# Patient Record
Sex: Male | Born: 1970 | Hispanic: No | Marital: Married | Smoking: Never smoker
Health system: Southern US, Community
[De-identification: ages and names within clinical notes are randomized; demographics above are authoritative.]

## PROBLEM LIST (undated history)

## (undated) DIAGNOSIS — E785 Hyperlipidemia, unspecified: Secondary | ICD-10-CM

## (undated) HISTORY — DX: Hyperlipidemia, unspecified: E78.5

---

## 1998-11-24 ENCOUNTER — Encounter: Admission: RE | Admit: 1998-11-24 | Discharge: 1998-11-24 | Payer: Self-pay | Admitting: Family Medicine

## 2000-10-25 ENCOUNTER — Ambulatory Visit (HOSPITAL_COMMUNITY): Admission: RE | Admit: 2000-10-25 | Discharge: 2000-10-25 | Payer: Self-pay | Admitting: Family Medicine

## 2000-10-25 ENCOUNTER — Encounter: Payer: Self-pay | Admitting: Family Medicine

## 2014-07-08 ENCOUNTER — Ambulatory Visit (HOSPITAL_COMMUNITY): Payer: No Typology Code available for payment source | Attending: Cardiology | Admitting: Cardiology

## 2014-07-08 ENCOUNTER — Other Ambulatory Visit (HOSPITAL_COMMUNITY): Payer: Self-pay | Admitting: Cardiology

## 2014-07-08 DIAGNOSIS — M79605 Pain in left leg: Secondary | ICD-10-CM | POA: Diagnosis not present

## 2014-07-08 DIAGNOSIS — M7989 Other specified soft tissue disorders: Secondary | ICD-10-CM | POA: Diagnosis not present

## 2014-07-08 NOTE — Progress Notes (Signed)
Left lower extremity venous duplex performed  

## 2016-12-22 DIAGNOSIS — M542 Cervicalgia: Secondary | ICD-10-CM | POA: Diagnosis not present

## 2016-12-22 DIAGNOSIS — M25511 Pain in right shoulder: Secondary | ICD-10-CM | POA: Diagnosis not present

## 2016-12-30 DIAGNOSIS — M25511 Pain in right shoulder: Secondary | ICD-10-CM | POA: Diagnosis not present

## 2017-01-05 DIAGNOSIS — M25531 Pain in right wrist: Secondary | ICD-10-CM | POA: Diagnosis not present

## 2018-10-27 DIAGNOSIS — R05 Cough: Secondary | ICD-10-CM | POA: Diagnosis not present

## 2018-10-31 DIAGNOSIS — J209 Acute bronchitis, unspecified: Secondary | ICD-10-CM | POA: Diagnosis not present

## 2018-11-07 DIAGNOSIS — R05 Cough: Secondary | ICD-10-CM | POA: Diagnosis not present

## 2019-09-22 ENCOUNTER — Ambulatory Visit: Payer: No Typology Code available for payment source | Attending: Internal Medicine

## 2019-09-22 DIAGNOSIS — Z23 Encounter for immunization: Secondary | ICD-10-CM

## 2019-09-22 NOTE — Progress Notes (Signed)
   Covid-19 Vaccination Clinic  Name:  Jay Johnston    MRN: [id number] DOB: [birth year]  09/22/2019  Jay Johnston was observed post Covid-19 immunization for 15 minutes without incident. He was provided with Vaccine Information Sheet and instruction to access the V-Safe system.   Jay Johnston was instructed to call 911 with any severe reactions post vaccine: Marland Kitchen Difficulty breathing  . Swelling of face and throat  . A fast heartbeat  . A bad rash all over body  . Dizziness and weakness   Immunizations Administered    Name Date Dose VIS Date Route   Pfizer COVID-19 Vaccine 09/22/2019  3:29 PM 0.3 mL 06/22/2019 Intramuscular   Manufacturer: [employer], [employer]   Lot: [id number]   NDC: [address]-4

## 2019-10-17 ENCOUNTER — Ambulatory Visit: Payer: No Typology Code available for payment source | Attending: Internal Medicine

## 2019-10-17 DIAGNOSIS — Z23 Encounter for immunization: Secondary | ICD-10-CM

## 2019-10-17 NOTE — Progress Notes (Signed)
   Covid-19 Vaccination Clinic  Name:  Jay Johnston    MRN: [id number] DOB: 01-10-1971  10/17/2019  [name] was observed post Covid-19 immunization for 15 minutes without incident. He was provided with Vaccine Information Sheet and instruction to access the V-Safe system.   [name] was instructed to call 911 with any severe reactions post vaccine: Marland Kitchen Difficulty breathing  . Swelling of face and throat  . A fast heartbeat  . A bad rash all over body  . Dizziness and weakness   Immunizations Administered    Name Date Dose VIS Date Route   Pfizer COVID-19 Vaccine 10/17/2019  2:10 PM 0.3 mL 06/22/2019 Intramuscular   Manufacturer: [employer], [employer]   Lot: [id number]   NDC: [address]-2

## 2019-11-13 ENCOUNTER — Other Ambulatory Visit: Payer: Self-pay

## 2019-11-13 ENCOUNTER — Emergency Department (HOSPITAL_COMMUNITY)
Admission: EM | Admit: 2019-11-13 | Discharge: 2019-11-14 | Disposition: A | Discharge status: Home / Self Care | Payer: 59 | Attending: Emergency Medicine | Admitting: Emergency Medicine

## 2019-11-13 ENCOUNTER — Emergency Department (HOSPITAL_COMMUNITY): Payer: 59

## 2019-11-13 ENCOUNTER — Encounter (HOSPITAL_COMMUNITY): Payer: Self-pay | Admitting: Emergency Medicine

## 2019-11-13 DIAGNOSIS — Y999 Unspecified external cause status: Secondary | ICD-10-CM | POA: Insufficient documentation

## 2019-11-13 DIAGNOSIS — W51XXXA Accidental striking against or bumped into by another person, initial encounter: Secondary | ICD-10-CM | POA: Insufficient documentation

## 2019-11-13 DIAGNOSIS — Y9366 Activity, soccer: Secondary | ICD-10-CM | POA: Diagnosis not present

## 2019-11-13 DIAGNOSIS — S0990XA Unspecified injury of head, initial encounter: Secondary | ICD-10-CM | POA: Diagnosis present

## 2019-11-13 DIAGNOSIS — Y929 Unspecified place or not applicable: Secondary | ICD-10-CM | POA: Diagnosis not present

## 2019-11-13 DIAGNOSIS — S060X1A Concussion with loss of consciousness of 30 minutes or less, initial encounter: Secondary | ICD-10-CM

## 2019-11-13 DIAGNOSIS — R4182 Altered mental status, unspecified: Secondary | ICD-10-CM | POA: Diagnosis not present

## 2019-11-13 LAB — CBC WITH DIFFERENTIAL/PLATELET
Abs Immature Granulocytes: 0.06 10*3/uL (ref 0.00–0.07)
Basophils Absolute: 0 10*3/uL (ref 0.0–0.1)
Basophils Relative: 0 %
Eosinophils Absolute: 0.1 10*3/uL (ref 0.0–0.5)
Eosinophils Relative: 1 %
HCT: 43.7 % (ref 39.0–52.0)
Hemoglobin: 14.7 g/dL (ref 13.0–17.0)
Immature Granulocytes: 1 %
Lymphocytes Relative: 13 %
Lymphs Abs: 1.4 10*3/uL (ref 0.7–4.0)
MCH: 28.1 pg (ref 26.0–34.0)
MCHC: 33.6 g/dL (ref 30.0–36.0)
MCV: 83.6 fL (ref 80.0–100.0)
Monocytes Absolute: 0.6 10*3/uL (ref 0.1–1.0)
Monocytes Relative: 5 %
Neutro Abs: 8.9 10*3/uL — ABNORMAL HIGH (ref 1.7–7.7)
Neutrophils Relative %: 80 %
Platelets: 248 10*3/uL (ref 150–400)
RBC: 5.23 MIL/uL (ref 4.22–5.81)
RDW: 11.8 % (ref 11.5–15.5)
WBC: 11 10*3/uL — ABNORMAL HIGH (ref 4.0–10.5)
nRBC: 0 % (ref 0.0–0.2)

## 2019-11-13 LAB — BASIC METABOLIC PANEL
Anion gap: 4 — ABNORMAL LOW (ref 5–15)
BUN: 19 mg/dL (ref 6–20)
CO2: 29 mmol/L (ref 22–32)
Calcium: 9.3 mg/dL (ref 8.9–10.3)
Chloride: 106 mmol/L (ref 98–111)
Creatinine, Ser: 1.24 mg/dL (ref 0.61–1.24)
GFR calc Af Amer: >60 mL/min (ref >60)
GFR calc non Af Amer: >60 mL/min (ref >60)
Glucose, Bld: 138 mg/dL — ABNORMAL HIGH (ref 70–99)
Potassium: 4.8 mmol/L (ref 3.5–5.1)
Sodium: 139 mmol/L (ref 135–145)

## 2019-11-13 MED ORDER — PROMETHAZINE HCL 25 MG/ML IJ SOLN
12.5000 mg | Freq: Once | INTRAMUSCULAR | Status: AC
  Administered 2019-11-13: 23:00:00 12.5 mg via INTRAVENOUS
  Filled 2019-11-13: qty 1

## 2019-11-13 NOTE — ED Triage Notes (Addendum)
Per [facility], pt was playing soccer, collided w/ another player, fell to a sitting position and then fell back and hit his head.  Ground consisted of artificial turf on concrete floor.  Pt has positive LOC for 20-30 seconds.  C/o headache and neck pain, placed in a C-collar by  EMS.  Pt does not remember the event but is alert and oreinted X 3 (trouble w/ dates) and has some repetititive questions.  Given 4mg  of zofran for nausea no relief.    96% RA 145/89 80 HR 18G L AC

## 2019-11-13 NOTE — ED Notes (Signed)
Pt to CT

## 2019-11-13 NOTE — ED Provider Notes (Signed)
[facility] EMERGENCY DEPARTMENT Provider Note   CSN: [id number] Arrival date & time: 11/13/19  2227     History Chief Complaint  Patient presents with  . Altered Mental Status    Jay Johnston is a 49 y.o. male.  Patient to ED from soccer match after he collided with another play head on. EMS reports he fell back to a sitting position and then fell backward with a brief LOC. He has had nausea since waking up and complains of neck pain. He is having difficulty remembering details of the injury. He denies chest/abdomen/extremity pain or numbness/weakness. Per EMS, he has had repetitive questioning since being picked up.   The history is provided by the patient. No language interpreter was used.  Altered Mental Status Presenting symptoms: confusion   Associated symptoms: headaches   Associated symptoms: no abdominal pain        No past medical history on file.     No family history on file.  Social History   Tobacco Use  . Smoking status: Not on file  Substance Use Topics  . Alcohol use: Not on file  . Drug use: Not on file    Home Medications Prior to Admission medications   Not on File    Allergies    Patient has no allergy information on record.  Review of Systems   Review of Systems  Cardiovascular: Negative for chest pain.  Gastrointestinal: Negative for abdominal pain.  Musculoskeletal: Positive for neck pain.  Skin: Negative for wound.  Neurological: Positive for headaches.  Psychiatric/Behavioral: Positive for confusion.    Physical Exam Updated Vital Signs There were no vitals taken for this visit.  Physical Exam Vitals and nursing note reviewed.  Constitutional:      Appearance: He is well-developed.  HENT:     Head: Normocephalic and atraumatic.     Nose: Nose normal.  Eyes:     Extraocular Movements: Extraocular movements intact.     Conjunctiva/sclera: Conjunctivae normal.     Pupils: Pupils are equal, round, and  reactive to light.  Cardiovascular:     Rate and Rhythm: Normal rate and regular rhythm.  Pulmonary:     Effort: Pulmonary effort is normal.     Breath sounds: Normal breath sounds. No wheezing, rhonchi or rales.  Chest:     Chest wall: No tenderness.  Abdominal:     General: Bowel sounds are normal.     Palpations: Abdomen is soft.     Tenderness: There is no abdominal tenderness. There is no guarding or rebound.  Musculoskeletal:        General: Normal range of motion.     Cervical back: Normal range of motion and neck supple.  Skin:    General: Skin is warm and dry.     Findings: No rash.  Neurological:     Mental Status: He is alert.     GCS: GCS eye subscore is 4. GCS verbal subscore is 5. GCS motor subscore is 6.     Cranial Nerves: No dysarthria or facial asymmetry.     Motor: No weakness or abnormal muscle tone.     Coordination: Coordination normal.     Comments: Speech clear. Delayed memory response to date but accurate.      ED Results / Procedures / Treatments   Labs (all labs ordered are listed, but only abnormal results are displayed) Labs Reviewed - No data to display  EKG None  Radiology No results found.  Procedures Procedures (including critical care time)  Medications Ordered in ED Medications  promethazine (PHENERGAN) injection 12.5 mg (has no administration in time range)    ED Course  I have reviewed the triage vital signs and the nursing notes.  Pertinent labs & imaging results that were available during my care of the patient were reviewed by me and considered in my medical decision making (see chart for details).    MDM Rules/Calculators/A&P                      Patient to ED after head injury while playing soccer, brief LOC, nausea, repetitive speech, oriented. CT's pending.   11:20 - re-eval - patient's recall improving. Aware of date and current president. Remembered my name and our previous conversation. Wife at bedside.   12:15  - CT studies negative. C-collar removed by me. FROM of neck without pain. He is ambulated and is steady. He can be discharged home. Stressed the importance of follow up with the concussion clinic in one week, which may be delayed due to wife's scheduled delivery induction.   Return precautions discussed.   Final Clinical Impression(s) / ED Diagnoses Final diagnoses:  None   1. Concussion  Rx / DC Orders ED Discharge Orders    None       Elpidio Anis, PA-C 11/14/19 0020    Shon Baton, MD 11/14/19 [phone]

## 2019-11-13 NOTE — ED Notes (Signed)
Provider at bedside

## 2019-11-14 ENCOUNTER — Telehealth: Payer: Self-pay | Admitting: Physical Therapy

## 2019-11-14 MED ORDER — ONDANSETRON 4 MG PO TBDP
4.0000 mg | ORAL_TABLET | Freq: Three times a day (TID) | ORAL | 0 refills | Status: DC | PRN
Start: 2019-11-14 — End: 2019-11-30

## 2019-11-14 NOTE — ED Notes (Signed)
Provider removed c-collar

## 2019-11-14 NOTE — ED Notes (Signed)
Stood pt up at bedside and walked a couple of steps. Pt was steady, reports his head is "only a little dizzy."

## 2019-11-14 NOTE — Telephone Encounter (Signed)
Called pt and LM on cell phone to return call if he would like to schedule a new pt appt for a concussion sustained on 11/13/19.  Pt can be scheduled in any 30 min slot that is between 8am-3:30 pm.  Have one block on hold tomorrow morning at 9:30 am or could do the 9:45am or 2:45 pm slot on Friday if needed.

## 2019-11-14 NOTE — Discharge Instructions (Signed)
It is important to follow the care instructions provided with your discharge papers. Follow up with the Concussion Clinic with [facility] in one week. Do not return to sports play until cleared by the Sports Medicine doctor.   Return to the emergency department with any worsening symptoms - severe headache, uncontrolled vomiting, severe dizziness or new concern.   Take Zofran for nausea and Tylenol for headache.

## 2019-11-15 ENCOUNTER — Telehealth: Payer: Self-pay | Admitting: Physical Therapy

## 2019-11-15 NOTE — Telephone Encounter (Signed)
Called and LM x 2 for pt to call and schedule an appt w/ Concussion clinic.  May schedule at any 30 min block between 8am-3:30 pm.

## 2019-11-30 ENCOUNTER — Other Ambulatory Visit: Payer: Self-pay

## 2019-11-30 ENCOUNTER — Ambulatory Visit (INDEPENDENT_AMBULATORY_CARE_PROVIDER_SITE_OTHER): Payer: 59 | Admitting: Family Medicine

## 2019-11-30 ENCOUNTER — Encounter: Payer: Self-pay | Admitting: Family Medicine

## 2019-11-30 VITALS — BP 110/70 | HR 76 | Ht 70.0 in | Wt 216.6 lb

## 2019-11-30 DIAGNOSIS — S060X9A Concussion with loss of consciousness of unspecified duration, initial encounter: Secondary | ICD-10-CM | POA: Diagnosis not present

## 2019-11-30 DIAGNOSIS — H8111 Benign paroxysmal vertigo, right ear: Secondary | ICD-10-CM

## 2019-11-30 NOTE — Progress Notes (Signed)
Subjective:    Chief Complaint: [name], LAT, ATC, am serving as scribe for Dr. Lynne Leader.  Jay Johnston,  is a 49 y.o. male who presents for evaluation of a head injury sustained on 11/13/19 while playing soccer.  Pt collided with another player and hit the back of his head on the ground.  Bystanders reported a brief LOC.  At the time of injury he reported neck pain and difficulty w/ memory.  Since then, pt reports issues w/ dizziness, neck pain, memory.  Looking up or down quickly, transitioning from sit to supine and laying supine in bed or trying to rotate in bed.  Injury date : 11/13/19 Visit #: 1   History of Present Illness:    Concussion Self-Reported Symptom Score Symptoms rated on a scale 1-6, in last 24 hours   Headache: 0    Nausea: 0  Dizziness: 5  Vomiting: 0  Balance Difficulty: 0   Trouble Falling Asleep: 2   Fatigue: 0  Sleep Less Than Usual: 0  Daytime Drowsiness: 0  Sleep More Than Usual: 0  Photophobia: 0  Phonophobia: 0  Irritability: 0  Sadness: 0  Numbness or Tingling: 0  Nervousness: 0  Feeling More Emotional: 3  Feeling Mentally Foggy: 0  Feeling Slowed Down: 0  Memory Problems: 0  Difficulty Concentrating: 0  Visual Problems: 0   Total # of Symptoms: 3/22 Total Symptom Score: 10/132 Previous Symptom Score: N/A   Neck Pain: Yes/No  Tinnitus: Yes/No  Review of Systems: No fevers or chills    Review of History: No history of vertigo.  Recently had a baby.  Owns a Chiropractor.  Objective:    Physical Examination Vitals:   11/30/19 0911  BP: 110/70  Pulse: 76  SpO2: 97%   MSK: C-spine normal-appearing normal motion. Neuro: Alert and oriented normal coordination.  Balance slight impaired to single-leg and tandem stance normal double leg stance. Significantly positive right Dix-Hallpike test. Psych: Normal speech thought process and affect.   Imaging:   CT Head Wo Contrast  Result Date: 11/13/2019 CLINICAL DATA:  [place],  hit back of head, loss of consciousness, headache EXAM: CT HEAD WITHOUT CONTRAST TECHNIQUE: Contiguous axial images were obtained from the base of the skull through the vertex without intravenous contrast. COMPARISON:  None. FINDINGS: Brain: No acute infarct or hemorrhage. Lateral ventricles and midline structures are unremarkable. No acute extra-axial fluid collections. No mass effect. Vascular: No hyperdense vessel or unexpected calcification. Skull: Normal. Negative for fracture or focal lesion. Sinuses/Orbits: Minimal mucosal thickening within the bilateral maxillary sinuses. Other: None. IMPRESSION: 1. No acute intracranial process. Electronically Signed   By: Randa Ngo M.D.   On: 11/13/2019 23:17   CT Cervical Spine Wo Contrast  Result Date: 11/13/2019 CLINICAL DATA:  [place], hit back of head, loss of consciousness, neck pain EXAM: CT CERVICAL SPINE WITHOUT CONTRAST TECHNIQUE: Multidetector CT imaging of the cervical spine was performed without intravenous contrast. Multiplanar CT image reconstructions were also generated. COMPARISON:  None. FINDINGS: Alignment: Alignment is anatomic. Skull base and vertebrae: No acute displaced fracture. Soft tissues and spinal canal: No prevertebral fluid or swelling. No visible canal hematoma. Disc levels: Mild spondylosis at C5-6 and C6-7, with symmetrical bilateral neural foraminal encroachment at those levels. Upper chest: Airway is patent.  Lung apices are clear. Other: Reconstructed images demonstrate no additional findings. IMPRESSION: 1. Mild lower cervical spondylosis.  No acute displaced fracture. Electronically Signed   By: Randa Ngo M.D.   On:  11/13/2019 23:19   I, Clementeen Graham, personally (independently) visualized and performed the interpretation of the images attached in this note.   Assessment and Plan   49 y.o. male with vertigo.  Patient certainly did have a concussion however I think the majority of his symptoms today are likely not related  directly to the concussion but more the impact.  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  Other than his vertigo symptoms he does not have significant concussion symptoms and has effectively returned to normal.  Discussed options.  Plan for referral to vestibular physical therapy which will be very helpful for management of BPPV.  Discussed meclizine which I do not think is going to be very helpful but could be used if needed.  Recheck back with me as needed.  Precautions reviewed.    Action/Discussion: Reviewed diagnosis, management options, expected outcomes, and the reasons for scheduled and emergent follow-up. Questions were adequately answered. Patient expressed verbal understanding and agreement with the following plan.     Patient Education:  Reviewed with patient the risks (i.e, a repeat concussion, post-concussion syndrome, second-impact syndrome) of returning to play prior to complete resolution, and thoroughly reviewed the signs and symptoms of concussion.Reviewed need for complete resolution of all symptoms, with rest AND exertion, prior to return to play.  Reviewed red flags for urgent medical evaluation: worsening symptoms, nausea/vomiting, intractable headache, musculoskeletal changes, focal neurological deficits.  Sports Concussion Clinic's Concussion Care Plan, which clearly outlines the plans stated above, was given to patient.   In addition to the time spent performing tests, I spent 30 min   Reviewed with patient the risks (i.e, a repeat concussion, post-concussion syndrome, second-impact syndrome) of returning to play prior to complete resolution, and thoroughly reviewed the signs and symptoms of      concussion. Reviewedf need for complete resolution of all symptoms, with rest AND exertion, prior to return to play.  Reviewed red flags for urgent medical evaluation: worsening symptoms, nausea/vomiting, intractable  headache, musculoskeletal changes, focal neurological deficits.  Sports Concussion Clinic's Concussion Care Plan, which clearly outlines the plans stated above, was given to patient   After Visit Summary printed out and provided to patient as appropriate.  The above documentation has been reviewed and is accurate and complete Clementeen Graham

## 2019-11-30 NOTE — Patient Instructions (Addendum)
Thank you for coming in today. I will refer to vestibular PT If you have a bad time with the dizziness ok to use meclizine over the counter up to 3x daily.   Recheck with me as needed.  Ok to contact me sooner if needed.    Benign Positional Vertigo Vertigo is the feeling that you or your surroundings are moving when they are not. Benign positional vertigo is the most common form of vertigo. This is usually a harmless condition (benign). This condition is positional. This means that symptoms are triggered by certain movements and positions. This condition can be dangerous if it occurs while you are doing something that could cause harm to you or others. This includes activities such as driving or operating machinery. What are the causes? In many cases, the cause of this condition is not known. It may be caused by a disturbance in an area of the inner ear that helps your brain to sense movement and balance. This disturbance can be caused by:  Viral infection (labyrinthitis).  Head injury.  Repetitive motion, such as jumping, dancing, or running. What increases the risk? You are more likely to develop this condition if:  You are a woman.  You are 40 years of age or older. What are the signs or symptoms? Symptoms of this condition usually happen when you move your head or your eyes in different directions. Symptoms may start suddenly, and usually last for less than a minute. They include:  Loss of balance and falling.  Feeling like you are spinning or moving.  Feeling like your surroundings are spinning or moving.  Nausea and vomiting.  Blurred vision.  Dizziness.  Involuntary eye movement (nystagmus). Symptoms can be mild and cause only minor problems, or they can be severe and interfere with daily life. Episodes of benign positional vertigo may return (recur) over time. Symptoms may improve over time. How is this diagnosed? This condition may be diagnosed based on:  Your  medical history.  Physical exam of the head, neck, and ears.  Tests, such as: ? MRI. ? CT scan. ? Eye movement tests. Your health care provider may ask you to change positions quickly while he or she watches you for symptoms of benign positional vertigo, such as nystagmus. Eye movement may be tested with a variety of exams that are designed to evaluate or stimulate vertigo. ? An electroencephalogram (EEG). This records electrical activity in your brain. ? Hearing tests. You may be referred to a health care provider who specializes in ear, nose, and throat (ENT) problems (otolaryngologist) or a provider who specializes in disorders of the nervous system (neurologist). How is this treated?  This condition may be treated in a session in which your health care provider moves your head in specific positions to adjust your inner ear back to normal. Treatment for this condition may take several sessions. Surgery may be needed in severe cases, but this is rare. In some cases, benign positional vertigo may resolve on its own in 2-4 weeks. Follow these instructions at home: Safety  Move slowly. Avoid sudden body or head movements or certain positions, as told by your health care provider.  Avoid driving until your health care provider says it is safe for you to do so.  Avoid operating heavy machinery until your health care provider says it is safe for you to do so.  Avoid doing any tasks that would be dangerous to you or others if vertigo occurs.  If you have trouble  walking or keeping your balance, try using a cane for stability. If you feel dizzy or unstable, sit down right away.  Return to your normal activities as told by your health care provider. Ask your health care provider what activities are safe for you. General instructions  Take over-the-counter and prescription medicines only as told by your health care provider.  Drink enough fluid to keep your urine pale yellow.  Keep all  follow-up visits as told by your health care provider. This is important. Contact a health care provider if:  You have a fever.  Your condition gets worse or you develop new symptoms.  Your family or friends notice any behavioral changes.  You have nausea or vomiting that gets worse.  You have numbness or a "pins and needles" sensation. Get help right away if you:  Have difficulty speaking or moving.  Are always dizzy.  Faint.  Develop severe headaches.  Have weakness in your legs or arms.  Have changes in your hearing or vision.  Develop a stiff neck.  Develop sensitivity to light. Summary  Vertigo is the feeling that you or your surroundings are moving when they are not. Benign positional vertigo is the most common form of vertigo.  The cause of this condition is not known. It may be caused by a disturbance in an area of the inner ear that helps your brain to sense movement and balance.  Symptoms include loss of balance and falling, feeling that you or your surroundings are moving, nausea and vomiting, and blurred vision.  This condition can be diagnosed based on symptoms, physical exam, and other tests, such as MRI, CT scan, eye movement tests, and hearing tests.  Follow safety instructions as told by your health care provider. You will also be told when to contact your health care provider in case of problems. This information is not intended to replace advice given to you by your health care provider. Make sure you discuss any questions you have with your health care provider. Document Revised: 12/07/2017 Document Reviewed: 12/07/2017 Elsevier Patient Education  [place].   How to Perform the Epley Maneuver The Epley maneuver is an exercise that relieves symptoms of vertigo. Vertigo is the feeling that you or your surroundings are moving when they are not. When you feel vertigo, you may feel like the room is spinning and have trouble walking. Dizziness is  a little different than vertigo. When you are dizzy, you may feel unsteady or light-headed. You can do this maneuver at home whenever you have symptoms of vertigo. You can do it up to 3 times a day until your symptoms go away. Even though the Epley maneuver may relieve your vertigo for a few weeks, it is possible that your symptoms will return. This maneuver relieves vertigo, but it does not relieve dizziness. What are the risks? If it is done correctly, the Epley maneuver is considered safe. Sometimes it can lead to dizziness or nausea that goes away after a short time. If you develop other symptoms, such as changes in vision, weakness, or numbness, stop doing the maneuver and call your health care provider. How to perform the Epley maneuver 1. Sit on the edge of a bed or table with your back straight and your legs extended or hanging over the edge of the bed or table. 2. Turn your head halfway toward the affected ear or side. 3. Lie backward quickly with your head turned until you are lying flat on your back.  You may want to position a pillow under your shoulders. 4. Hold this position for 30 seconds. You may experience an attack of vertigo. This is normal. 5. Turn your head to the opposite direction until your unaffected ear is facing the floor. 6. Hold this position for 30 seconds. You may experience an attack of vertigo. This is normal. Hold this position until the vertigo stops. 7. Turn your whole body to the same side as your head. Hold for another 30 seconds. 8. Sit back up. You can repeat this exercise up to 3 times a day. Follow these instructions at home:  After doing the Epley maneuver, you can return to your normal activities.  Ask your health care provider if there is anything you should do at home to prevent vertigo. He or she may recommend that you: ? Keep your head raised (elevated) with two or more pillows while you sleep. ? Do not sleep on the side of your affected ear. ? Get  up slowly from bed. ? Avoid sudden movements during the day. ? Avoid extreme head movement, like looking up or bending over. Contact a health care provider if:  Your vertigo gets worse.  You have other symptoms, including: ? Nausea. ? Vomiting. ? Headache. Get help right away if:  You have vision changes.  You have a severe or worsening headache or neck pain.  You cannot stop vomiting.  You have new numbness or weakness in any part of your body. Summary  Vertigo is the feeling that you or your surroundings are moving when they are not.  The Epley maneuver is an exercise that relieves symptoms of vertigo.  If the Epley maneuver is done correctly, it is considered safe. You can do it up to 3 times a day. This information is not intended to replace advice given to you by your health care provider. Make sure you discuss any questions you have with your health care provider. Document Revised: 06/10/2017 Document Reviewed: 05/18/2016 Elsevier Patient Education  2020 [employer].

## 2019-12-06 ENCOUNTER — Ambulatory Visit: Payer: 59 | Attending: Family Medicine | Admitting: Physical Therapy

## 2019-12-06 ENCOUNTER — Other Ambulatory Visit: Payer: Self-pay

## 2019-12-06 ENCOUNTER — Ambulatory Visit: Payer: 59 | Admitting: Physical Therapy

## 2019-12-06 ENCOUNTER — Encounter: Payer: Self-pay | Admitting: Physical Therapy

## 2019-12-06 DIAGNOSIS — R42 Dizziness and giddiness: Secondary | ICD-10-CM | POA: Insufficient documentation

## 2019-12-06 DIAGNOSIS — H8111 Benign paroxysmal vertigo, right ear: Secondary | ICD-10-CM | POA: Diagnosis not present
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
## 2020-07-19 ENCOUNTER — Other Ambulatory Visit: Payer: Self-pay

## 2020-07-19 DIAGNOSIS — Z20822 Contact with and (suspected) exposure to covid-19: Secondary | ICD-10-CM

## 2020-07-22 LAB — NOVEL CORONAVIRUS, NAA: SARS-CoV-2, NAA: NOT DETECTED
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
## 2021-09-26 ENCOUNTER — Other Ambulatory Visit: Payer: Self-pay

## 2021-09-26 ENCOUNTER — Emergency Department (HOSPITAL_BASED_OUTPATIENT_CLINIC_OR_DEPARTMENT_OTHER): Payer: 59 | Admitting: Radiology

## 2021-09-26 ENCOUNTER — Emergency Department (HOSPITAL_BASED_OUTPATIENT_CLINIC_OR_DEPARTMENT_OTHER)
Admission: EM | Admit: 2021-09-26 | Discharge: 2021-09-26 | Disposition: A | Discharge status: Home / Self Care | Payer: 59 | Attending: Emergency Medicine | Admitting: Emergency Medicine

## 2021-09-26 ENCOUNTER — Encounter (HOSPITAL_BASED_OUTPATIENT_CLINIC_OR_DEPARTMENT_OTHER): Payer: Self-pay | Admitting: Pediatrics

## 2021-09-26 DIAGNOSIS — R000 Tachycardia, unspecified: Secondary | ICD-10-CM | POA: Diagnosis not present

## 2021-09-26 DIAGNOSIS — B349 Viral infection, unspecified: Secondary | ICD-10-CM | POA: Diagnosis not present

## 2021-09-26 DIAGNOSIS — J189 Pneumonia, unspecified organism: Secondary | ICD-10-CM | POA: Diagnosis not present

## 2021-09-26 DIAGNOSIS — R112 Nausea with vomiting, unspecified: Secondary | ICD-10-CM

## 2021-09-26 DIAGNOSIS — R059 Cough, unspecified: Secondary | ICD-10-CM | POA: Diagnosis not present

## 2021-09-26 DIAGNOSIS — Z20822 Contact with and (suspected) exposure to covid-19: Secondary | ICD-10-CM | POA: Diagnosis not present

## 2021-09-26 LAB — CBC WITH DIFFERENTIAL/PLATELET
Abs Immature Granulocytes: 0.04 10*3/uL (ref 0.00–0.07)
Basophils Absolute: 0 10*3/uL (ref 0.0–0.1)
Basophils Relative: 0 %
Eosinophils Absolute: 0 10*3/uL (ref 0.0–0.5)
Eosinophils Relative: 0 %
HCT: 40.6 % (ref 39.0–52.0)
Hemoglobin: 13.8 g/dL (ref 13.0–17.0)
Immature Granulocytes: 0 %
Lymphocytes Relative: 8 %
Lymphs Abs: 1 10*3/uL (ref 0.7–4.0)
MCH: 27.8 pg (ref 26.0–34.0)
MCHC: 34 g/dL (ref 30.0–36.0)
MCV: 81.7 fL (ref 80.0–100.0)
Monocytes Absolute: 0.9 10*3/uL (ref 0.1–1.0)
Monocytes Relative: 7 %
Neutro Abs: 10.8 10*3/uL — ABNORMAL HIGH (ref 1.7–7.7)
Neutrophils Relative %: 85 %
Platelets: 231 10*3/uL (ref 150–400)
RBC: 4.97 MIL/uL (ref 4.22–5.81)
RDW: 12.1 % (ref 11.5–15.5)
WBC: 12.8 10*3/uL — ABNORMAL HIGH (ref 4.0–10.5)
nRBC: 0 % (ref 0.0–0.2)

## 2021-09-26 LAB — RESP PANEL BY RT-PCR (FLU A&B, COVID) ARPGX2
Influenza A by PCR: NEGATIVE
Influenza B by PCR: NEGATIVE
SARS Coronavirus 2 by RT PCR: NEGATIVE

## 2021-09-26 LAB — COMPREHENSIVE METABOLIC PANEL
ALT: 25 U/L (ref 0–44)
AST: 19 U/L (ref 15–41)
Albumin: 3.8 g/dL (ref 3.5–5.0)
Alkaline Phosphatase: 67 U/L (ref 38–126)
Anion gap: 7 (ref 5–15)
BUN: 14 mg/dL (ref 6–20)
CO2: 25 mmol/L (ref 22–32)
Calcium: 9.2 mg/dL (ref 8.9–10.3)
Chloride: 99 mmol/L (ref 98–111)
Creatinine, Ser: 1.08 mg/dL (ref 0.61–1.24)
GFR, Estimated: >60 mL/min (ref >60)
Glucose, Bld: 109 mg/dL — ABNORMAL HIGH (ref 70–99)
Potassium: 4 mmol/L (ref 3.5–5.1)
Sodium: 131 mmol/L — ABNORMAL LOW (ref 135–145)
Total Bilirubin: 0.8 mg/dL (ref 0.3–1.2)
Total Protein: 6.6 g/dL (ref 6.5–8.1)

## 2021-09-26 LAB — LIPASE, BLOOD: Lipase: 11 U/L (ref 11–51)

## 2021-09-26 MED ORDER — ACETAMINOPHEN 325 MG PO TABS: 650.0000 mg | ORAL_TABLET | Freq: Four times a day (QID) | ORAL | 0 refills | Status: DC | PRN

## 2021-09-26 MED ORDER — ONDANSETRON HCL 4 MG PO TABS: 4.0000 mg | ORAL_TABLET | ORAL | 0 refills | Status: DC | PRN

## 2021-09-26 MED ORDER — ONDANSETRON HCL 4 MG/2ML IJ SOLN
4.0000 mg | Freq: Once | INTRAMUSCULAR | Status: AC
  Administered 2021-09-26: 4 mg via INTRAVENOUS
  Filled 2021-09-26: qty 2

## 2021-09-26 MED ORDER — SUCRALFATE 1 G PO TABS: 1.0000 g | ORAL_TABLET | Freq: Three times a day (TID) | ORAL | 0 refills | Status: DC

## 2021-09-26 MED ORDER — OXYCODONE HCL 5 MG PO TABS: 5.0000 mg | ORAL_TABLET | ORAL | 0 refills | Status: DC | PRN

## 2021-09-26 MED ORDER — LACTATED RINGERS IV BOLUS
2000.0000 mL | Freq: Once | INTRAVENOUS | Status: AC
  Administered 2021-09-26: 1000 mL via INTRAVENOUS

## 2021-09-26 MED ORDER — KETOROLAC TROMETHAMINE 30 MG/ML IJ SOLN
30.0000 mg | Freq: Once | INTRAMUSCULAR | Status: AC
  Administered 2021-09-26: 30 mg via INTRAVENOUS
  Filled 2021-09-26: qty 1

## 2021-09-26 NOTE — ED Triage Notes (Signed)
C/O cough, nausea, vomiting and body aches x 3 days; unable keep in things down. Denies PMH.  ?

## 2021-09-26 NOTE — ED Notes (Signed)
Patient reported coughing up blood, ? ?

## 2021-09-26 NOTE — ED Provider Notes (Signed)
?[facility] ?Provider Note ? ? ?CSN: [id number] ?Arrival date & time: 09/26/21  [id number] ? ?  ? ?History ? ?Chief Complaint  ?Patient presents with  ? Cough  ? ? ?Jay Johnston is a 51 y.o. male. ? ?This is a 51 y.o. male without significant medical history as below who presents to the ED with complaint of cough, congestion, nausea, vomiting, arthralgias, malaise.  Symptom onset 4 to 5 days ago.  Worse in the past 48 hours.  Difficult to tolerate p.o. secondary to nausea.  He took 80 mg of Motrin last night which did improve his symptoms but made his nausea worse.  Again took Motrin this morning which worsened his nausea.  No vomiting.  No change in bowel or bladder function.  No recent travel or sick contacts.  Does report some blood-streaked sputum over the past few days.  Yellow-colored phlegm intermittently.  No dyspnea or chest pain ? ? ? ?History reviewed. No pertinent past medical history. ? ?No past surgical history on file.  ? ? ?The history is provided by the patient. No language interpreter was used.  ?Cough ?Associated symptoms: chills, fever and myalgias   ? ?  ? ?Home Medications ?Prior to Admission medications   ?Medication Sig Start Date End Date Taking? Authorizing Provider  ?acetaminophen (TYLENOL) 325 MG tablet Take 2 tablets (650 mg total) by mouth every 6 (six) hours as needed. 09/26/21  Yes Jeanell Sparrow, DO  ?ondansetron (ZOFRAN) 4 MG tablet Take 1 tablet (4 mg total) by mouth every 4 (four) hours as needed for nausea or vomiting. 09/26/21  Yes Wynona Dove A, DO  ?oxyCODONE (ROXICODONE) 5 MG immediate release tablet Take 1 tablet (5 mg total) by mouth every 4 (four) hours as needed for up to 55 doses for severe pain. 09/26/21  Yes Wynona Dove A, DO  ?sucralfate (CARAFATE) 1 g tablet Take 1 tablet (1 g total) by mouth 4 (four) times daily -  with meals and at bedtime for 5 days. 09/26/21 10/01/21 Yes Jeanell Sparrow, DO  ?   ? ?Allergies    ?Patient has no known allergies.    ? ?Review of Systems   ?Review of Systems  ?Constitutional:  Positive for appetite change, chills and fever.  ?Respiratory:  Positive for cough.   ?Gastrointestinal:  Positive for nausea.  ?Musculoskeletal:  Positive for arthralgias and myalgias.  ? ?Physical Exam ?Updated Vital Signs ?BP 124/76   Pulse 80   Temp 98.9 ?F (37.2 ?C)   Resp (!) 21   Ht 5\' 10"  (1.778 m)   Wt 95.3 kg   SpO2 98%   BMI 30.13 kg/m?  ?Physical Exam ?Vitals and nursing note reviewed.  ?Constitutional:   ?   General: He is not in acute distress. ?   Appearance: He is well-developed. He is not toxic-appearing.  ?HENT:  ?   Head: Normocephalic and atraumatic.  ?   Right Ear: External ear normal.  ?   Left Ear: External ear normal.  ?   Mouth/Throat:  ?   Mouth: Mucous membranes are dry.  ?Eyes:  ?   General: No scleral icterus. ?Cardiovascular:  ?   Rate and Rhythm: Normal rate and regular rhythm.  ?   Pulses: Normal pulses.  ?   Heart sounds: Normal heart sounds.  ?Pulmonary:  ?   Effort: Pulmonary effort is normal. No tachypnea, accessory muscle usage or respiratory distress.  ?   Breath sounds: Normal breath sounds. No wheezing.  ?  Abdominal:  ?   General: Abdomen is flat. There is no distension.  ?   Palpations: Abdomen is soft.  ?   Tenderness: There is no abdominal tenderness. There is no guarding or rebound.  ?Musculoskeletal:     ?   General: Normal range of motion.  ?   Cervical back: Normal range of motion.  ?   Right lower leg: No edema.  ?   Left lower leg: No edema.  ?Skin: ?   General: Skin is warm and dry.  ?   Capillary Refill: Capillary refill takes less than 2 seconds.  ?Neurological:  ?   Mental Status: He is alert and oriented to person, place, and time.  ?Psychiatric:     ?   Mood and Affect: Mood normal.     ?   Behavior: Behavior normal.  ? ? ?ED Results / Procedures / Treatments   ?Labs ?(all labs ordered are listed, but only abnormal results are displayed) ?Labs Reviewed  ?CBC WITH DIFFERENTIAL/PLATELET -  Abnormal; Notable for the following components:  ?    Result Value  ? WBC 12.8 (*)   ? Neutro Abs 10.8 (*)   ? All other components within normal limits  ?COMPREHENSIVE METABOLIC PANEL - Abnormal; Notable for the following components:  ? Sodium 131 (*)   ? Glucose, Bld 109 (*)   ? All other components within normal limits  ?RESP PANEL BY RT-PCR (FLU A&B, COVID) [id number]  ?LIPASE, BLOOD  ? ? ?EKG ?EKG Interpretation ? ?Date/Time:  Saturday September 26 2021 10:28:02 EDT ?Ventricular Rate:  94 ?PR Interval:  149 ?QRS Duration: 86 ?QT Interval:  328 ?QTC Calculation: 411 ?R Axis:   54 ?Text Interpretation: Sinus rhythm Abnormal inferior Q waves Similar to prior tracing Confirmed by Wynona Dove ([id number]) on 09/26/2021 1:07:23 PM ? ?Radiology ?DG Chest 2 View ? ?Result Date: 09/26/2021 ?CLINICAL DATA:  Cough.  Fever. EXAM: CHEST - 2 VIEW COMPARISON:  None. FINDINGS: The heart, hila, and mediastinum are normal. No pneumothorax. No nodules or masses. Minimal atelectasis in the left base. No suspicious infiltrates. No nodules or masses. IMPRESSION: No active cardiopulmonary disease. Electronically Signed   By: Dorise Bullion III M.D.   On: 09/26/2021 09:48   ? ?Procedures ?Procedures  ? ? ?Medications Ordered in ED ?Medications  ?lactated ringers bolus 2,000 mL (1,000 mLs Intravenous New Bag/Given 09/26/21 1040)  ?ketorolac (TORADOL) 30 MG/ML injection 30 mg (30 mg Intravenous Given 09/26/21 1041)  ?ondansetron (ZOFRAN) injection 4 mg (4 mg Intravenous Given 09/26/21 1041)  ? ? ?ED Course/ Medical Decision Making/ A&P ?  ?                        ?Medical Decision Making ?Amount and/or Complexity of Data Reviewed ?Labs: ordered. ?Radiology: ordered. ? ?Risk ?Prescription drug management. ? ? ?Initial Impression and Ddx ?This patient presents to the Emergency Department for the above complaint. This involves an extensive number of treatment options and is a complaint that carries with it a high risk of complications and morbidity. Vital  signs were reviewed.  ? ?Serious etiologies considered. Ddx includes but is not limited to: Viral syndrome, gastroenteritis, metabolic, infectious, dehydration ? ?Patient PMH that increases complexity of ED encounter:  n/a ? ?Previous records obtained and reviewed  ? ?Additional history obtained from spouse ? ? ?Interpretation of Diagnostics ?Labs & imaging results that were available during my care of the patient were visualized by me and considered  in my medical decision making. ?  ?I ordered imaging studies which included CXR and I visualized the imaging and I agree with radiologist interpretation. No acute process noted ? ?Cardiac monitoring reviewed and interpreted personally which shows sinus tachycardia ? ?Social determinants of health include -  N/a ? ?Personally discussed patient care with consultant; n/a ? ? ? ?Patient Reassessment and Ultimate Disposition/Management ? ?Patient management required discussion with the following services or consulting groups:  None ? ?Labs reviewed and are stable, he has mild leukocytosis.  Not septic.  Patient reports overall that he is feeling significantly better following medications and IV fluids.  He is able to tolerate oral intake without difficulty.  Favor viral syndrome as etiology of patient's pending complaints. ? ?Discussed abortive care at home, oral rehydration, analgesics for home, antiemetics.  ? ?Close PCP follow-up. ? ?The patient improved significantly and was discharged in stable condition. Detailed discussions were had with the patient regarding current findings, and need for close f/u with PCP or on call doctor. The patient has been instructed to return immediately if the symptoms worsen in any way for re-evaluation. Patient verbalized understanding and is in agreement with current care plan. All questions answered prior to discharge. ? ? ?  ? ? ? ? ? ? ? ? ? ? ? ? ? ? ? ? ? ? ? ? ? ? ? ?Complexity of Problems Addressed ?Acute illness or injury that  poses threat of life of bodily function ? ?Additional Data Reviewed and Analyzed ?Further history obtained from: ?Further history from spouse/family member, Past medical history and medications listed in the EMR, Prior

## 2021-09-26 NOTE — ED Notes (Signed)
Dc instructions reviewed with patient. Patient voiced understanding. Dc with belongings.  °

## 2021-09-26 NOTE — Discharge Instructions (Addendum)
It was a pleasure caring for you today in the emergency department. ° °Please return to the emergency department for any worsening or worrisome symptoms. ° ° °

## 2021-09-27 ENCOUNTER — Encounter (HOSPITAL_BASED_OUTPATIENT_CLINIC_OR_DEPARTMENT_OTHER): Payer: Self-pay

## 2021-09-27 ENCOUNTER — Other Ambulatory Visit: Payer: Self-pay

## 2021-09-27 DIAGNOSIS — J189 Pneumonia, unspecified organism: Secondary | ICD-10-CM | POA: Insufficient documentation

## 2021-09-27 DIAGNOSIS — R1010 Upper abdominal pain, unspecified: Secondary | ICD-10-CM | POA: Insufficient documentation

## 2021-09-27 DIAGNOSIS — J069 Acute upper respiratory infection, unspecified: Secondary | ICD-10-CM | POA: Diagnosis not present

## 2021-09-27 DIAGNOSIS — R112 Nausea with vomiting, unspecified: Secondary | ICD-10-CM | POA: Insufficient documentation

## 2021-09-27 DIAGNOSIS — Z20822 Contact with and (suspected) exposure to covid-19: Secondary | ICD-10-CM | POA: Diagnosis not present

## 2021-09-27 DIAGNOSIS — R059 Cough, unspecified: Secondary | ICD-10-CM | POA: Diagnosis not present

## 2021-09-27 DIAGNOSIS — R0981 Nasal congestion: Secondary | ICD-10-CM | POA: Diagnosis not present

## 2021-09-27 NOTE — ED Triage Notes (Signed)
Pt was seen here yesterday r/t cough, fevers, body aches, abdominal pain, and nausea. Was dx with a viral syndrome and prescribed zofran, Carafate, and oxycodone. Reports worsening symptoms ?

## 2021-09-28 ENCOUNTER — Emergency Department (HOSPITAL_BASED_OUTPATIENT_CLINIC_OR_DEPARTMENT_OTHER)
Admission: EM | Admit: 2021-09-28 | Discharge: 2021-09-28 | Disposition: A | Discharge status: Home / Self Care | Payer: 59 | Attending: Emergency Medicine | Admitting: Emergency Medicine

## 2021-09-28 ENCOUNTER — Emergency Department (HOSPITAL_BASED_OUTPATIENT_CLINIC_OR_DEPARTMENT_OTHER): Payer: 59

## 2021-09-28 DIAGNOSIS — J189 Pneumonia, unspecified organism: Secondary | ICD-10-CM

## 2021-09-28 DIAGNOSIS — J069 Acute upper respiratory infection, unspecified: Secondary | ICD-10-CM

## 2021-09-28 DIAGNOSIS — R059 Cough, unspecified: Secondary | ICD-10-CM | POA: Diagnosis not present

## 2021-09-28 LAB — CBC WITH DIFFERENTIAL/PLATELET
Abs Immature Granulocytes: 0.1 10*3/uL — ABNORMAL HIGH (ref 0.00–0.07)
Basophils Absolute: 0.1 10*3/uL (ref 0.0–0.1)
Basophils Relative: 0 %
Eosinophils Absolute: 0 10*3/uL (ref 0.0–0.5)
Eosinophils Relative: 0 %
HCT: 42.4 % (ref 39.0–52.0)
Hemoglobin: 14.3 g/dL (ref 13.0–17.0)
Immature Granulocytes: 1 %
Lymphocytes Relative: 7 %
Lymphs Abs: 1.2 10*3/uL (ref 0.7–4.0)
MCH: 27.8 pg (ref 26.0–34.0)
MCHC: 33.7 g/dL (ref 30.0–36.0)
MCV: 82.5 fL (ref 80.0–100.0)
Monocytes Absolute: 0.9 10*3/uL (ref 0.1–1.0)
Monocytes Relative: 6 %
Neutro Abs: 14 10*3/uL — ABNORMAL HIGH (ref 1.7–7.7)
Neutrophils Relative %: 86 %
Platelets: 218 10*3/uL (ref 150–400)
RBC: 5.14 MIL/uL (ref 4.22–5.81)
RDW: 12.2 % (ref 11.5–15.5)
WBC: 16.2 10*3/uL — ABNORMAL HIGH (ref 4.0–10.5)
nRBC: 0 % (ref 0.0–0.2)

## 2021-09-28 LAB — RESP PANEL BY RT-PCR (FLU A&B, COVID) ARPGX2
Influenza A by PCR: NEGATIVE
Influenza B by PCR: NEGATIVE
SARS Coronavirus 2 by RT PCR: NEGATIVE

## 2021-09-28 LAB — COMPREHENSIVE METABOLIC PANEL
ALT: 22 U/L (ref 0–44)
AST: 20 U/L (ref 15–41)
Albumin: 3.5 g/dL (ref 3.5–5.0)
Alkaline Phosphatase: 73 U/L (ref 38–126)
Anion gap: 9 (ref 5–15)
BUN: 15 mg/dL (ref 6–20)
CO2: 27 mmol/L (ref 22–32)
Calcium: 8.9 mg/dL (ref 8.9–10.3)
Chloride: 95 mmol/L — ABNORMAL LOW (ref 98–111)
Creatinine, Ser: 1.17 mg/dL (ref 0.61–1.24)
GFR, Estimated: >60 mL/min (ref >60)
Glucose, Bld: 118 mg/dL — ABNORMAL HIGH (ref 70–99)
Potassium: 3.8 mmol/L (ref 3.5–5.1)
Sodium: 131 mmol/L — ABNORMAL LOW (ref 135–145)
Total Bilirubin: 1 mg/dL (ref 0.3–1.2)
Total Protein: 6.8 g/dL (ref 6.5–8.1)

## 2021-09-28 LAB — GROUP A STREP BY PCR: Group A Strep by PCR: NOT DETECTED

## 2021-09-28 LAB — LIPASE, BLOOD: Lipase: <10 U/L — ABNORMAL LOW (ref 11–51)

## 2021-09-28 MED ORDER — KETOROLAC TROMETHAMINE 30 MG/ML IJ SOLN
30.0000 mg | Freq: Once | INTRAMUSCULAR | Status: AC
  Administered 2021-09-28: 30 mg via INTRAVENOUS
  Filled 2021-09-28: qty 1

## 2021-09-28 MED ORDER — ONDANSETRON HCL 4 MG/2ML IJ SOLN
4.0000 mg | Freq: Once | INTRAMUSCULAR | Status: AC
  Administered 2021-09-28: 4 mg via INTRAVENOUS
  Filled 2021-09-28: qty 2

## 2021-09-28 MED ORDER — SODIUM CHLORIDE 0.9 % IV BOLUS
1000.0000 mL | Freq: Once | INTRAVENOUS | Status: AC
  Administered 2021-09-28: 1000 mL via INTRAVENOUS

## 2021-09-28 MED ORDER — AMOXICILLIN-POT CLAVULANATE 875-125 MG PO TABS
1.0000 | ORAL_TABLET | Freq: Two times a day (BID) | ORAL | 0 refills | Status: DC
Start: 2021-09-28 — End: 2021-12-28

## 2021-09-28 MED ORDER — AZITHROMYCIN 250 MG PO TABS: 250.0000 mg | ORAL_TABLET | Freq: Every day | ORAL | 0 refills | Status: DC

## 2021-09-28 MED ORDER — DICYCLOMINE HCL 10 MG PO CAPS
10.0000 mg | ORAL_CAPSULE | Freq: Once | ORAL | Status: AC
  Administered 2021-09-28: 10 mg via ORAL
  Filled 2021-09-28: qty 1

## 2021-09-28 MED ORDER — PROMETHAZINE HCL 25 MG PO TABS
25.0000 mg | ORAL_TABLET | Freq: Once | ORAL | Status: AC
  Administered 2021-09-28: 25 mg via ORAL
  Filled 2021-09-28: qty 1

## 2021-09-28 MED ORDER — AMOXICILLIN-POT CLAVULANATE 875-125 MG PO TABS
1.0000 | ORAL_TABLET | Freq: Two times a day (BID) | ORAL | Status: DC
  Administered 2021-09-28: 1 via ORAL
  Filled 2021-09-28: qty 1

## 2021-09-28 MED ORDER — SODIUM CHLORIDE 0.9 % IV SOLN
500.0000 mg | Freq: Once | INTRAVENOUS | Status: AC
  Administered 2021-09-28: 500 mg via INTRAVENOUS
  Filled 2021-09-28: qty 5

## 2021-09-28 NOTE — Discharge Instructions (Signed)
You were seen today for ongoing symptoms of cough, fever, nausea, abdominal discomfort.  Your testing remains reassuring.  While your chest x-ray was read as negative, highly suspect he may have a developing left lower lobe pneumonia.  Take medications as prescribed.  Make sure that you are staying hydrated.  Follow-up closely with your primary physician if not improving ?

## 2021-09-28 NOTE — ED Notes (Signed)
Pt verbalizes understanding of discharge instructions. Opportunity for questioning and answers were provided. Pt discharged from ED to home with wife.    

## 2021-09-28 NOTE — ED Provider Notes (Signed)
?[facility] EMERGENCY DEPT ?Provider Note ? ? ?CSN: [id number] ?Arrival date & time: 09/27/21  2050 ? ?  ? ?History ? ?Chief Complaint  ?Patient presents with  ? Cough  ? Abdominal Pain  ? ? ?Jay Johnston is a 51 y.o. male. ? ?HPI ? ?  ? ?This is a 51 year old male who presents with ongoing cough, chills, body aches, abdominal pain, nausea, vomiting.  Wife provides most of the history.  Reports that they were seen and evaluated yesterday morning for the same.  He had negative COVID and influenza testing at that time.  Chest x-ray was clean.  He was discharged with Carafate, Zofran, and oxycodone for symptoms.  He has had ongoing chills.  Wife reports that he is coughing and has what appears to be fresh blood streaked in his sputum.  No sore throat although his wife states that they recently had strep throat in the house.  He has had a fever up to 104 at home.  He reports upper abdominal cramping and pain.  He has been taking Tylenol for his chills with minimal relief. ? ?Home Medications ?Prior to Admission medications   ?Medication Sig Start Date End Date Taking? Authorizing Provider  ?amoxicillin-clavulanate (AUGMENTIN) 875-125 MG tablet Take 1 tablet by mouth every 12 (twelve) hours. 09/28/21  Yes Jenaro Souder, Mayer Masker, MD  ?azithromycin (ZITHROMAX) 250 MG tablet Take 1 tablet (250 mg total) by mouth daily. Take first 2 tablets together, then 1 every day until finished. 09/28/21  Yes Latorsha Curling, Mayer Masker, MD  ?acetaminophen (TYLENOL) 325 MG tablet Take 2 tablets (650 mg total) by mouth every 6 (six) hours as needed. 09/26/21   Sloan Leiter, DO  ?ondansetron (ZOFRAN) 4 MG tablet Take 1 tablet (4 mg total) by mouth every 4 (four) hours as needed for nausea or vomiting. 09/26/21   Tanda Rockers A, DO  ?oxyCODONE (ROXICODONE) 5 MG immediate release tablet Take 1 tablet (5 mg total) by mouth every 4 (four) hours as needed for up to 55 doses for severe pain. 09/26/21   Sloan Leiter, DO  ?sucralfate (CARAFATE) 1  g tablet Take 1 tablet (1 g total) by mouth 4 (four) times daily -  with meals and at bedtime for 5 days. 09/26/21 10/01/21  Sloan Leiter, DO  ?   ? ?Allergies    ?Patient has no known allergies.   ? ?Review of Systems   ?Review of Systems  ?Constitutional:  Positive for chills and fever.  ?HENT:  Positive for congestion.   ?Respiratory:  Positive for cough.   ?Gastrointestinal:  Positive for abdominal pain, nausea and vomiting.  ?Musculoskeletal:  Positive for myalgias.  ?All other systems reviewed and are negative. ? ?Physical Exam ?Updated Vital Signs ?BP 114/78   Pulse 78   Temp 99.7 ?F (37.6 ?C) (Oral)   Resp 18   Ht 1.778 m (5\' 10" )   Wt 95.3 kg   SpO2 94%   BMI 30.13 kg/m?  ?Physical Exam ?Vitals and nursing note reviewed.  ?Constitutional:   ?   Appearance: He is well-developed. He is ill-appearing. He is not toxic-appearing.  ?HENT:  ?   Head: Normocephalic and atraumatic.  ?   Comments: Mucous membranes dry ?   Mouth/Throat:  ?   Mouth: Mucous membranes are moist.  ?   Pharynx: Oropharynx is clear. No oropharyngeal exudate.  ?Eyes:  ?   Pupils: Pupils are equal, round, and reactive to light.  ?Cardiovascular:  ?   Rate and  Rhythm: Normal rate and regular rhythm.  ?   Heart sounds: Normal heart sounds. No murmur heard. ?Pulmonary:  ?   Effort: Pulmonary effort is normal. No respiratory distress.  ?   Breath sounds: Normal breath sounds. No wheezing.  ?Abdominal:  ?   General: Bowel sounds are normal.  ?   Palpations: Abdomen is soft.  ?   Tenderness: There is no abdominal tenderness. There is no guarding or rebound.  ?Musculoskeletal:  ?   Cervical back: Neck supple.  ?Lymphadenopathy:  ?   Cervical: No cervical adenopathy.  ?Skin: ?   General: Skin is warm and dry.  ?Neurological:  ?   Mental Status: He is alert and oriented to person, place, and time.  ?Psychiatric:     ?   Mood and Affect: Mood normal.  ? ? ?ED Results / Procedures / Treatments   ?Labs ?(all labs ordered are listed, but only  abnormal results are displayed) ?Labs Reviewed  ?CBC WITH DIFFERENTIAL/PLATELET - Abnormal; Notable for the following components:  ?    Result Value  ? WBC 16.2 (*)   ? Neutro Abs 14.0 (*)   ? Abs Immature Granulocytes 0.10 (*)   ? All other components within normal limits  ?COMPREHENSIVE METABOLIC PANEL - Abnormal; Notable for the following components:  ? Sodium 131 (*)   ? Chloride 95 (*)   ? Glucose, Bld 118 (*)   ? All other components within normal limits  ?LIPASE, BLOOD - Abnormal; Notable for the following components:  ? Lipase <10 (*)   ? All other components within normal limits  ?RESP PANEL BY RT-PCR (FLU A&B, COVID) [id number]  ?GROUP A STREP BY PCR  ? ? ?EKG ?None ? ?Radiology ?DG Chest 2 View ? ?Result Date: 09/26/2021 ?CLINICAL DATA:  Cough.  Fever. EXAM: CHEST - 2 VIEW COMPARISON:  None. FINDINGS: The heart, hila, and mediastinum are normal. No pneumothorax. No nodules or masses. Minimal atelectasis in the left base. No suspicious infiltrates. No nodules or masses. IMPRESSION: No active cardiopulmonary disease. Electronically Signed   By: Gerome Samavid  Williams III M.D.   On: 09/26/2021 09:48  ? ?DG Chest Portable 1 View ? ?Result Date: 09/28/2021 ?CLINICAL DATA:  Cough EXAM: PORTABLE CHEST 1 VIEW COMPARISON:  09/26/2021 FINDINGS: Lungs are clear.  No pleural effusion or pneumothorax. The heart is normal in size. IMPRESSION: No evidence of acute cardiopulmonary disease. Electronically Signed   By: Charline BillsSriyesh  Krishnan M.D.   On: 09/28/2021 02:54   ? ?Procedures ?Procedures  ? ? ?Medications Ordered in ED ?Medications  ?amoxicillin-clavulanate (AUGMENTIN) 875-125 MG per tablet 1 tablet (1 tablet Oral Given 09/28/21 0355)  ?sodium chloride 0.9 % bolus 1,000 mL (0 mLs Intravenous Stopped 09/28/21 0355)  ?ketorolac (TORADOL) 30 MG/ML injection 30 mg (30 mg Intravenous Given 09/28/21 0227)  ?ondansetron [facility](ZOFRAN) injection 4 mg (4 mg Intravenous Given 09/28/21 0227)  ?azithromycin (ZITHROMAX) 500 mg in sodium chloride 0.9 %  250 mL IVPB (0 mg Intravenous Stopped 09/28/21 0512)  ?promethazine (PHENERGAN) tablet 25 mg (25 mg Oral Given 09/28/21 0427)  ?dicyclomine (BENTYL) capsule 10 mg (10 mg Oral Given 09/28/21 0511)  ? ? ?ED Course/ Medical Decision Making/ A&P ?  ?                        ?Medical Decision Making ?Amount and/or Complexity of Data Reviewed ?Labs: ordered. ?Radiology: ordered. ? ?Risk ?Prescription drug management. ? ? ?This patient presents to the ED for  concern of fever, cough, nausea, this involves an extensive number of treatment options, and is a complaint that carries with it a high risk of complications and morbidity.  The differential diagnosis includes viral illness such as flu, pneumonia, COVID ? ?MDM:   ? ?Patient presents with ongoing worsening symptoms with myalgias, fever, cough.  He is nontoxic-appearing.  Temperature of 102.7.  He was seen and evaluated on Saturday with negative work-up including COVID and influenza testing.  He is ill-appearing but nontoxic on exam.  He has a nontender abdomen.  Reports worsening cough.  While he is not hypoxic or in any respiratory distress, with question pneumonia.  We will repeat lab work.  Have also requested repeat viral testing to ensure no false negative.  Patient was given fluids and Zofran.  Viral testing is negative.  White count increased to 16.  Otherwise no significant metabolic derangements.  Repeat chest x-ray was read as negative.  I independently reviewed this and feel like he has some haziness in the left lower lobe.  Given clinical picture, would elect to treat for community-acquired pneumonia given ongoing symptoms.  Patient reported some persistent nausea but no vomiting.  He is able to tolerate p.o.  Recommend ongoing supportive measures.  We will treat with Augmentin and azithromycin for community-acquired pneumonia.  Recommend outpatient follow-up. ?(Labs, imaging) ? ?Labs: ?I Ordered, and personally interpreted labs.  The pertinent results include:  Leukocytosis to 16, no significant metabolic derangements ? ?Imaging Studies ordered: ?I ordered imaging studies including chest x-ray read as negative, on my assessment, haziness of the left lower lobe ?I independ

## 2021-10-28 ENCOUNTER — Ambulatory Visit: Payer: 59

## 2021-11-12 DIAGNOSIS — K219 Gastro-esophageal reflux disease without esophagitis: Secondary | ICD-10-CM | POA: Diagnosis not present

## 2021-11-12 DIAGNOSIS — J309 Allergic rhinitis, unspecified: Secondary | ICD-10-CM | POA: Diagnosis not present

## 2021-11-12 DIAGNOSIS — Z1211 Encounter for screening for malignant neoplasm of colon: Secondary | ICD-10-CM | POA: Diagnosis not present

## 2021-11-12 DIAGNOSIS — B002 Herpesviral gingivostomatitis and pharyngotonsillitis: Secondary | ICD-10-CM | POA: Diagnosis not present

## 2021-11-12 DIAGNOSIS — Z125 Encounter for screening for malignant neoplasm of prostate: Secondary | ICD-10-CM | POA: Diagnosis not present

## 2021-11-12 DIAGNOSIS — Z0000 Encounter for general adult medical examination without abnormal findings: Secondary | ICD-10-CM | POA: Diagnosis not present

## 2021-11-12 DIAGNOSIS — E7800 Pure hypercholesterolemia, unspecified: Secondary | ICD-10-CM | POA: Diagnosis not present

## 2021-11-13 ENCOUNTER — Other Ambulatory Visit: Payer: Self-pay | Admitting: Family Medicine

## 2021-11-13 DIAGNOSIS — E7800 Pure hypercholesterolemia, unspecified: Secondary | ICD-10-CM

## 2021-11-16 DIAGNOSIS — E7800 Pure hypercholesterolemia, unspecified: Secondary | ICD-10-CM | POA: Diagnosis not present

## 2021-11-16 DIAGNOSIS — Z0000 Encounter for general adult medical examination without abnormal findings: Secondary | ICD-10-CM | POA: Diagnosis not present

## 2021-11-16 DIAGNOSIS — Z125 Encounter for screening for malignant neoplasm of prostate: Secondary | ICD-10-CM | POA: Diagnosis not present

## 2021-12-17 ENCOUNTER — Ambulatory Visit
Admission: RE | Admit: 2021-12-17 | Discharge: 2021-12-17 | Disposition: A | Discharge status: Home / Self Care | Payer: Self-pay | Source: Ambulatory Visit | Attending: Family Medicine | Admitting: Family Medicine

## 2021-12-17 DIAGNOSIS — E7800 Pure hypercholesterolemia, unspecified: Secondary | ICD-10-CM

## 2021-12-28 ENCOUNTER — Ambulatory Visit: Payer: No Typology Code available for payment source | Admitting: Cardiology

## 2021-12-28 ENCOUNTER — Encounter: Payer: Self-pay | Admitting: Cardiology

## 2021-12-28 VITALS — BP 128/84 | HR 76 | Temp 97.9°F | Resp 16 | Ht 70.0 in | Wt 224.6 lb

## 2021-12-28 DIAGNOSIS — R079 Chest pain, unspecified: Secondary | ICD-10-CM

## 2021-12-28 DIAGNOSIS — K219 Gastro-esophageal reflux disease without esophagitis: Secondary | ICD-10-CM

## 2021-12-28 DIAGNOSIS — R0609 Other forms of dyspnea: Secondary | ICD-10-CM

## 2021-12-28 MED ORDER — PANTOPRAZOLE SODIUM 40 MG PO TBEC: 40.0000 mg | DELAYED_RELEASE_TABLET | Freq: Every day | ORAL | 2 refills | Status: DC

## 2021-12-28 NOTE — Progress Notes (Signed)
Primary Physician/Referring:  Elias Else, MD  Patient ID: Jay Johnston, male    DOB: [birth year], 51 y.o.   MRN: [id number]  Chief Complaint  Patient presents with   Chest pain   New Patient (Initial Visit)   HPI:    Jay Johnston  is a 51 y.o. male patient with no significant prior cardiac history, over the past 1 to 2 months has been having chest tightness occasionally, but mostly notices these episodes associated with diet.  He has not had any exertional chest tightness or heaviness.  Lately he has been having to get up in the middle of the night feeling like he needs to drop and heartburning especially if he ate late or drank a lot of water prior to going to bed.  Over the past 2-3 years, he has gained about 20 to 25 pounds in weight, has not been exercising regularly due to having young children and work schedule.  Since then he has also noticed dyspnea on exertion and states that he gives out easily compared to previous.  No PND or orthopnea.  No leg edema.  There is no family history of hypercoagulable state.  Past Medical History:  Diagnosis Date   Hyperlipidemia    History reviewed. No pertinent surgical history. Family History  Problem Relation Age of Onset   Parkinson's disease Mother    Stroke Mother    Dementia Father    Breast cancer Sister 43   Heart failure Brother     Social History   Tobacco Use   Smoking status: Never   Smokeless tobacco: Never  Substance Use Topics   Alcohol use: Never   Marital Status: Married  ROS  Review of Systems  Cardiovascular:  Positive for chest pain and dyspnea on exertion. Negative for leg swelling.  Gastrointestinal:  Positive for heartburn.   Objective  Blood pressure 128/84, pulse 76, temperature 97.9 F (36.6 C), temperature source Temporal, resp. rate 16, height 5\' 10"  (1.778 m), weight 224 lb 9.6 oz (101.9 kg), SpO2 96 %. Body mass index is 32.23 kg/m.     12/28/2021    3:15 PM 09/28/2021    6:00 AM 09/28/2021     5:00 AM  Vitals with BMI  Height 5\' 10"     Weight 224 lbs 10 oz    BMI 32.23    Systolic 128 107 09/30/2021  Diastolic 84 73 78  Pulse 76 77 78    Physical Exam Constitutional:      Appearance: He is obese.  Neck:     Vascular: No JVD.  Cardiovascular:     Rate and Rhythm: Normal rate and regular rhythm.     Pulses: Intact distal pulses.     Heart sounds: Normal heart sounds. No murmur heard.    No gallop.  Pulmonary:     Effort: Pulmonary effort is normal.     Breath sounds: Normal breath sounds.  Abdominal:     General: Bowel sounds are normal.     Palpations: Abdomen is soft.  Musculoskeletal:     Right lower leg: No edema.     Left lower leg: No edema.     Medications and allergies  No Known Allergies   Medication list after today's encounter   Current Outpatient Medications:    atorvastatin (LIPITOR) 10 MG tablet, Take 10 mg by mouth at bedtime., Disp: , Rfl:    pantoprazole (PROTONIX) 40 MG tablet, Take 1 tablet (40 mg total) by mouth daily before breakfast., Disp:  30 tablet, Rfl: 2  Laboratory examination:   Recent Labs    09/26/21 1040 09/28/21 0213  NA 131* 131*  K 4.0 3.8  CL 99 95*  CO2 25 27  GLUCOSE 109* 118*  BUN 14 15  CREATININE 1.08 1.17  CALCIUM 9.2 8.9  GFRNONAA >60 >60   CrCl cannot be calculated (Patient's most recent lab result is older than the maximum 21 days allowed.).     Latest Ref Rng & Units 09/28/2021    2:13 AM 09/26/2021   10:40 AM 11/13/2019   10:36 PM  CMP  Glucose 70 - 99 mg/dL 947  654  650   BUN 6 - 20 mg/dL 15  14  19    Creatinine 0.61 - 1.24 mg/dL  3.54  6.56   Sodium 135 - 145 mmol/L 131  131  139   Potassium 3.5 - 5.1 mmol/L 3.8  4.0  4.8   Chloride 98 - 111 mmol/L 95  99  106   CO2 22 - 32 mmol/L 27  25  29    Calcium 8.9 - 10.3 mg/dL 8.9  9.2  9.3   Total Protein 6.5 - 8.1 g/dL 6.8  6.6    Total Bilirubin 0.3 - 1.2 mg/dL 1.0  0.8    Alkaline Phos 38 - 126 U/L 73  67    AST 15 - 41 U/L 20  19    ALT 0 - 44  U/L 22  25        Latest Ref Rng & Units 09/28/2021    2:13 AM 09/26/2021   10:40 AM 11/13/2019   10:36 PM  CBC  WBC 4.0 - 10.5 K/uL 16.2  12.8  11.0   Hemoglobin 13.0 - 17.0 g/dL 09/28/2021  01/13/2020  75.1   Hematocrit 39.0 - 52.0 % 42.4  40.6  43.7   Platelets 150 - 400 K/uL 218  231  248    External labs:   Cholesterol, total 234.000 m 11/16/2021 HDL 48.000 mg 11/16/2021 LDL 154.000 m 11/16/2021 Triglycerides 179.000 m 11/16/2021  Radiology:    Cardiac Studies:   Coronary calcium score 12/17/2021: 1. Coronary calcium score is 0. 2. Small hiatal hernia. 3.  No significant extracardiac abnormality.  Normal ascending and descending thoracic aortic measurements.  EKG:   EKG 12/28/2021: Normal sinus rhythm at rate of 58 bpm, normal EKG.    Assessment     ICD-10-CM   1. Chest pain of uncertain etiology  R07.9 EKG 12-Lead    2. Dyspnea on exertion  R06.09 PCV ECHOCARDIOGRAM COMPLETE    PCV CARDIAC STRESS TEST    3. Chest pain due to GERD  K21.9 PCV CARDIAC STRESS TEST   R07.9 pantoprazole (PROTONIX) 40 MG tablet       Meds ordered this encounter  Medications   pantoprazole (PROTONIX) 40 MG tablet    Sig: Take 1 tablet (40 mg total) by mouth daily before breakfast.    Dispense:  30 tablet    Refill:  2   Orders Placed This Encounter  Procedures   PCV CARDIAC STRESS TEST    Standing Status:   Future    Standing Expiration Date:   02/27/2022   EKG 12-Lead   PCV ECHOCARDIOGRAM COMPLETE    Standing Status:   Future    Standing Expiration Date:   12/29/2022   Recommendations:   Jay Johnston is a 52 y.o. male patient with no significant prior cardiac history, over the past 1 to 2 months  has been having chest tightness occasionally, but mostly notices these episodes associated with diet.    Over the past 2-3 years, he has gained about 20 to 25 pounds in weight, has not been exercising regularly due to having young children and work schedule, has also noticed dyspnea on exertion and  states that he gives out easily compared to previous.  No PND or orthopnea.  No leg edema.  There is no family history of hypercoagulable state.  I reviewed his coronary calcium score which is 0, he has recently been started on statin therapy which is appropriate for primary prevention.  His chest pain symptoms are clearly related to GERD but I would like to still perform a routine treadmill stress test and also an echocardiogram to evaluate his dyspnea which I suspect is due to deconditioning and weight gain.  His ideal body weight would be <190 pounds.  His symptoms of GERD have gotten worse, he has gotten up in the night and has to sit up on a chair.  Advised him not to eat at least 2 to 3 hours prior to going to bed, to avoid spicy foods especially in the evening dinner, and to avoid citrus foods.  We also discussed regarding weight loss, avoidance of high calorie diet although "healthy Mediterranean diet", also to at least exercise vigorously 2-3 times a week.  I will changes Prilosec to pantoprazole 40 mg before breakfast, to be used on a daily basis for the next 3 weeks following which he can change to as needed use.  If symptoms persistent cardiac work-up is negative, he may need GI consultation.  Small hiatal hernia probably again contributed by weight gain.  I will see him back on a as needed basis.   Yates Decamp, MD, [facility] 12/28/2021, 5:03 PM Office: [phone]

## 2021-12-30 MED ORDER — PANTOPRAZOLE SODIUM 40 MG PO TBEC: 40.0000 mg | DELAYED_RELEASE_TABLET | Freq: Every day | ORAL | 2 refills | Status: AC

## 2021-12-30 NOTE — Addendum Note (Signed)
Addended by: Delrae Rend on: 12/30/2021 04:54 PM   Modules accepted: Orders

## 2022-01-13 ENCOUNTER — Ambulatory Visit: Payer: 59

## 2022-01-13 DIAGNOSIS — R0609 Other forms of dyspnea: Secondary | ICD-10-CM

## 2022-09-17 ENCOUNTER — Ambulatory Visit: Payer: 59 | Attending: Physician Assistant

## 2022-09-17 DIAGNOSIS — R2681 Unsteadiness on feet: Secondary | ICD-10-CM | POA: Insufficient documentation

## 2022-09-17 DIAGNOSIS — R42 Dizziness and giddiness: Secondary | ICD-10-CM | POA: Insufficient documentation

## 2022-09-17 NOTE — Therapy (Signed)
OUTPATIENT PHYSICAL THERAPY VESTIBULAR EVALUATION     Patient Name: Jay Johnston MRN: [id number] DOB:01/28/1971, 52 y.o., male Today's Date: 09/17/2022  END OF SESSION:  PT End of Session - 09/17/22 0927     Visit Number 1    Number of Visits 9    Date for PT Re-Evaluation 10/15/22    Authorization Type [employer]    PT Start Time 0930    PT Stop Time 1000    PT Time Calculation (min) 30 min    Activity Tolerance Patient tolerated treatment well    Behavior During Therapy [facility] for tasks assessed/performed             Past Medical History:  Diagnosis Date   Hyperlipidemia    History reviewed. No pertinent surgical history. There are no problems to display for this patient.   PCP: Maury Dus, MD REFERRING PROVIDER: Cephas Darby, PA  REFERRING DIAG: R42 (ICD-10-CM) - Vertigo   THERAPY DIAG:  Dizziness and giddiness - Plan: PT plan of care cert/re-cert  Unsteadiness on feet - Plan: PT plan of care cert/re-cert  ONSET DATE: [id number] referral  Rationale for Evaluation and Treatment: Rehabilitation  SUBJECTIVE:   SUBJECTIVE STATEMENT: Patient arrives to clinic by himself walking without AD. Patient sustained a concussion in May of 2021 and has had intermittent dizziness since then. Was previously seen here for BPPV. Reporting increase in dizziness in the past 1.5-2 weeks. Denies any head injury or MVC to precipitate this sudden increase. Reports that symptoms feel as though they're in his neck, but dizziness feels like it did last time he was here. Initial injury sustained to R side of head falling onto turf.  Pt accompanied by: self  PERTINENT HISTORY: Previous BPPV  PAIN:  Are you having pain? No  PRECAUTIONS: None  WEIGHT BEARING RESTRICTIONS: No  FALLS: Has patient fallen in last 6 months? No  LIVING ENVIRONMENT: Lives with: lives with their family Lives in: House/apartment Stairs: Yes: Internal: 1 flight steps; on right going up Has following  equipment at home: None  PLOF: Independentdriving, working  PATIENT GOALS: "to decrease dizziness"  OBJECTIVE:   DIAGNOSTIC FINDINGS: clear imaging done at initial injury in 2021  COGNITION: Overall cognitive status: Within functional limits for tasks assessed   SENSATION: WFL  POSTURE:  No Significant postural limitations  Cervical ROM:   WFL Active A/PROM (deg) eval  Flexion   Extension   Right lateral flexion   Left lateral flexion   Right rotation   Left rotation   (Blank rows = not tested)  STRENGTH: WFL  BED MOBILITY:  Able to complete independently, but does report intermittent dizziness with bed mobility   TRANSFERS: Assistive device utilized: None  Sit to stand: Complete Independence Stand to sit: Complete Independence Chair to chair: Complete Independence  GAIT: Gait pattern: WFL  PATIENT SURVEYS:  FOTO 48; expected to be at 35  VESTIBULAR ASSESSMENT:  GENERAL OBSERVATION: NAD   SYMPTOM BEHAVIOR:  Subjective history: see above  Non-Vestibular symptoms: changes in vision and neck pain  Type of dizziness: Spinning/Vertigo, "Funny feeling in the head", and "fogginess"  Frequency: sporadic   Duration: seconds  Aggravating factors: Induced by position change: lying supine, supine to sit, and sit to stand and Induced by motion: occur when walking, looking up at the ceiling, bending down to the ground, turning body quickly, turning head quickly, and activity in general  Relieving factors: closing eyes and slow movements  Progression of symptoms:  fluctuate  OCULOMOTOR  EXAM:  Ocular Alignment: normal  Ocular ROM: No Limitations  Spontaneous Nystagmus: absent  Gaze-Induced Nystagmus: absent  Smooth Pursuits: intact  Saccades: intact  Convergence/Divergence: <5 cm   VESTIBULAR - OCULAR REFLEX:   Slow VOR: Normal  VOR Cancellation: Normal  Head-Impulse Test: HIT Right: positive HIT Left: negative   POSITIONAL TESTING: Right Dix-Hallpike: no  nystagmus Left Dix-Hallpike: no nystagmus Right Roll Test: no nystagmus Left Roll Test: no nystagmus  MOTION SENSITIVITY:  Motion Sensitivity Quotient Intensity: 0 = none, 1 = Lightheaded, 2 = Mild, 3 = Moderate, 4 = Severe, 5 = Vomiting  Intensity  1. Sitting to supine 1  2. Supine to L side 0  3. Supine to R side 1  4. Supine to sitting 1  5. L Hallpike-Dix 0  6. Up from L  0  7. R Hallpike-Dix 3  8. Up from R  2  9. Sitting, head tipped to L knee 0  10. Head up from L knee 0  11. Sitting, head tipped to R knee 0  12. Head up from R knee 0  13. Sitting head turns x5 2  14.Sitting head nods x5 0  15. In stance, 180 turn to L  0  16. In stance, 180 turn to R 2    OTHOSTATICS: seated: BP 131/79 HR 64 Standing: BP 142/97 HR: 84     VESTIBULAR TREATMENT:                                                                                                   N/a eval  PATIENT EDUCATION: Education details: PT POC, exam findings Person educated: Patient Education method: Explanation Education comprehension: verbalized understanding  HOME EXERCISE PROGRAM:  GOALS: Goals reviewed with patient? Yes  SHORT TERM GOALS: = LTG based on PT POC  LONG TERM GOALS: Target date: 10/15/22  Pt will be independent with final HEP for improved symptom report  Baseline: to be provided  Goal status: INITIAL  2.  Pt will report </= 1/5 for all movements on MSQ to indicate improvement in motion sensitivity and improved activity tolerance.   Baseline: up to 3/5 Goal status: INITIAL  3.  DVA goal Baseline: to be assessed Goal status: INITIAL  4.  SOT goal vs M-CTSIB Baseline: to be assessed Goal status: INITIAL  5. Patient will score >/= 57 on FOTO to demonstrate improved symptom report  Baseline: 48  Goal status: INITIAL  ASSESSMENT:  CLINICAL IMPRESSION: Patient is a 52 y.o. male who was seen today for physical therapy evaluation and treatment for intermittent dizziness.  Patient sustained a concussion in 2021 due to falling onto the R side of his head. Was previously treated here for BPPV. On exam today, patient without BPPV, but exam consistent with R vestibular hypofunction. He would benefit from skilled PT services to address the above mentioned deficits.    OBJECTIVE IMPAIRMENTS: decreased balance and dizziness.   ACTIVITY LIMITATIONS: carrying, lifting, bending, bed mobility, locomotion level, and caring for others  PARTICIPATION LIMITATIONS: interpersonal relationship, driving, shopping, community activity, occupation, and yard work  PERSONAL FACTORS:  Past/current experiences and Time since onset of injury/illness/exacerbation are also affecting patient's functional outcome.   REHAB POTENTIAL: Good  CLINICAL DECISION MAKING: Stable/uncomplicated  EVALUATION COMPLEXITY: Low   PLAN:  PT FREQUENCY: 2x/week  PT DURATION: 4 weeks  PLANNED INTERVENTIONS: Therapeutic exercises, Therapeutic activity, Neuromuscular re-education, Balance training, Gait training, Patient/Family education, Self Care, Joint mobilization, Stair training, Vestibular training, Canalith repositioning, Visual/preceptual remediation/compensation, DME instructions, Aquatic Therapy, Manual therapy, and Re-evaluation  PLAN FOR NEXT SESSION: HEP, DVA, M-CTSIB vs SOT   Debbora Dus, PT Debbora Dus, PT, DPT, CBIS  09/17/2022, 10:15 AM

## 2022-09-27 ENCOUNTER — Ambulatory Visit: Payer: 59 | Admitting: Physical Therapy

## 2022-09-28 ENCOUNTER — Ambulatory Visit: Payer: 59

## 2022-10-04 ENCOUNTER — Telehealth: Payer: Self-pay | Admitting: Physical Therapy

## 2022-10-04 ENCOUNTER — Ambulatory Visit: Payer: 59 | Admitting: Physical Therapy

## 2022-10-04 NOTE — Telephone Encounter (Signed)
Called patient and patient informed therapist that he is going on hold per MD request. Patient reported calling clinic already to cancel visits but appears visits were not canceled. Therapist did so now. Patient reported that he was told to go on hold for PT until after antibiotic stops to manage inner ear infection and then MD will re-evaluate need for continued PT. Will leave case open at this time and re-evaluate as indicated.   Malachi Carl, PT, DPT

## 2022-10-06 ENCOUNTER — Ambulatory Visit: Payer: 59

## 2022-10-11 ENCOUNTER — Ambulatory Visit: Payer: 59

## 2022-12-01 NOTE — Therapy (Unsigned)
[facility] [address] [place], [place], [address] Phone: [phone]   Fax:  [phone]  Patient Details  Name: Jay Johnston MRN: [id number] Date of Birth: [birth year] Referring Provider:  No ref. provider found  Encounter Date: 12/01/2022  PHYSICAL THERAPY DISCHARGE SUMMARY  Visits from Start of Care: 1  Current functional level related to goals / functional outcomes: Unable to be assessed as patient has not returned since last appt   Remaining deficits: Unable to be assessed as patient has not returned since last appt   Education / Equipment: PT POC, exam findings   Patient agrees to discharge. Patient goals were  Unable to be assessed as patient has not returned since last appt . Patient is being discharged due to not returning since the last visit.  Westley Foots, PT, DPT, CBIS 12/01/2022, 10:09 AM  [facility] [address] [place], [place], [address] Phone: [phone]   Fax:  [phone]

## 2023-03-16 IMAGING — DX DG CHEST 2V
2 series · 2 of 2 positions shown · non-contrast
Comparison: None.

CLINICAL DATA: Cough.  Fever.

EXAM:
CHEST - 2 VIEW

[chest pa]
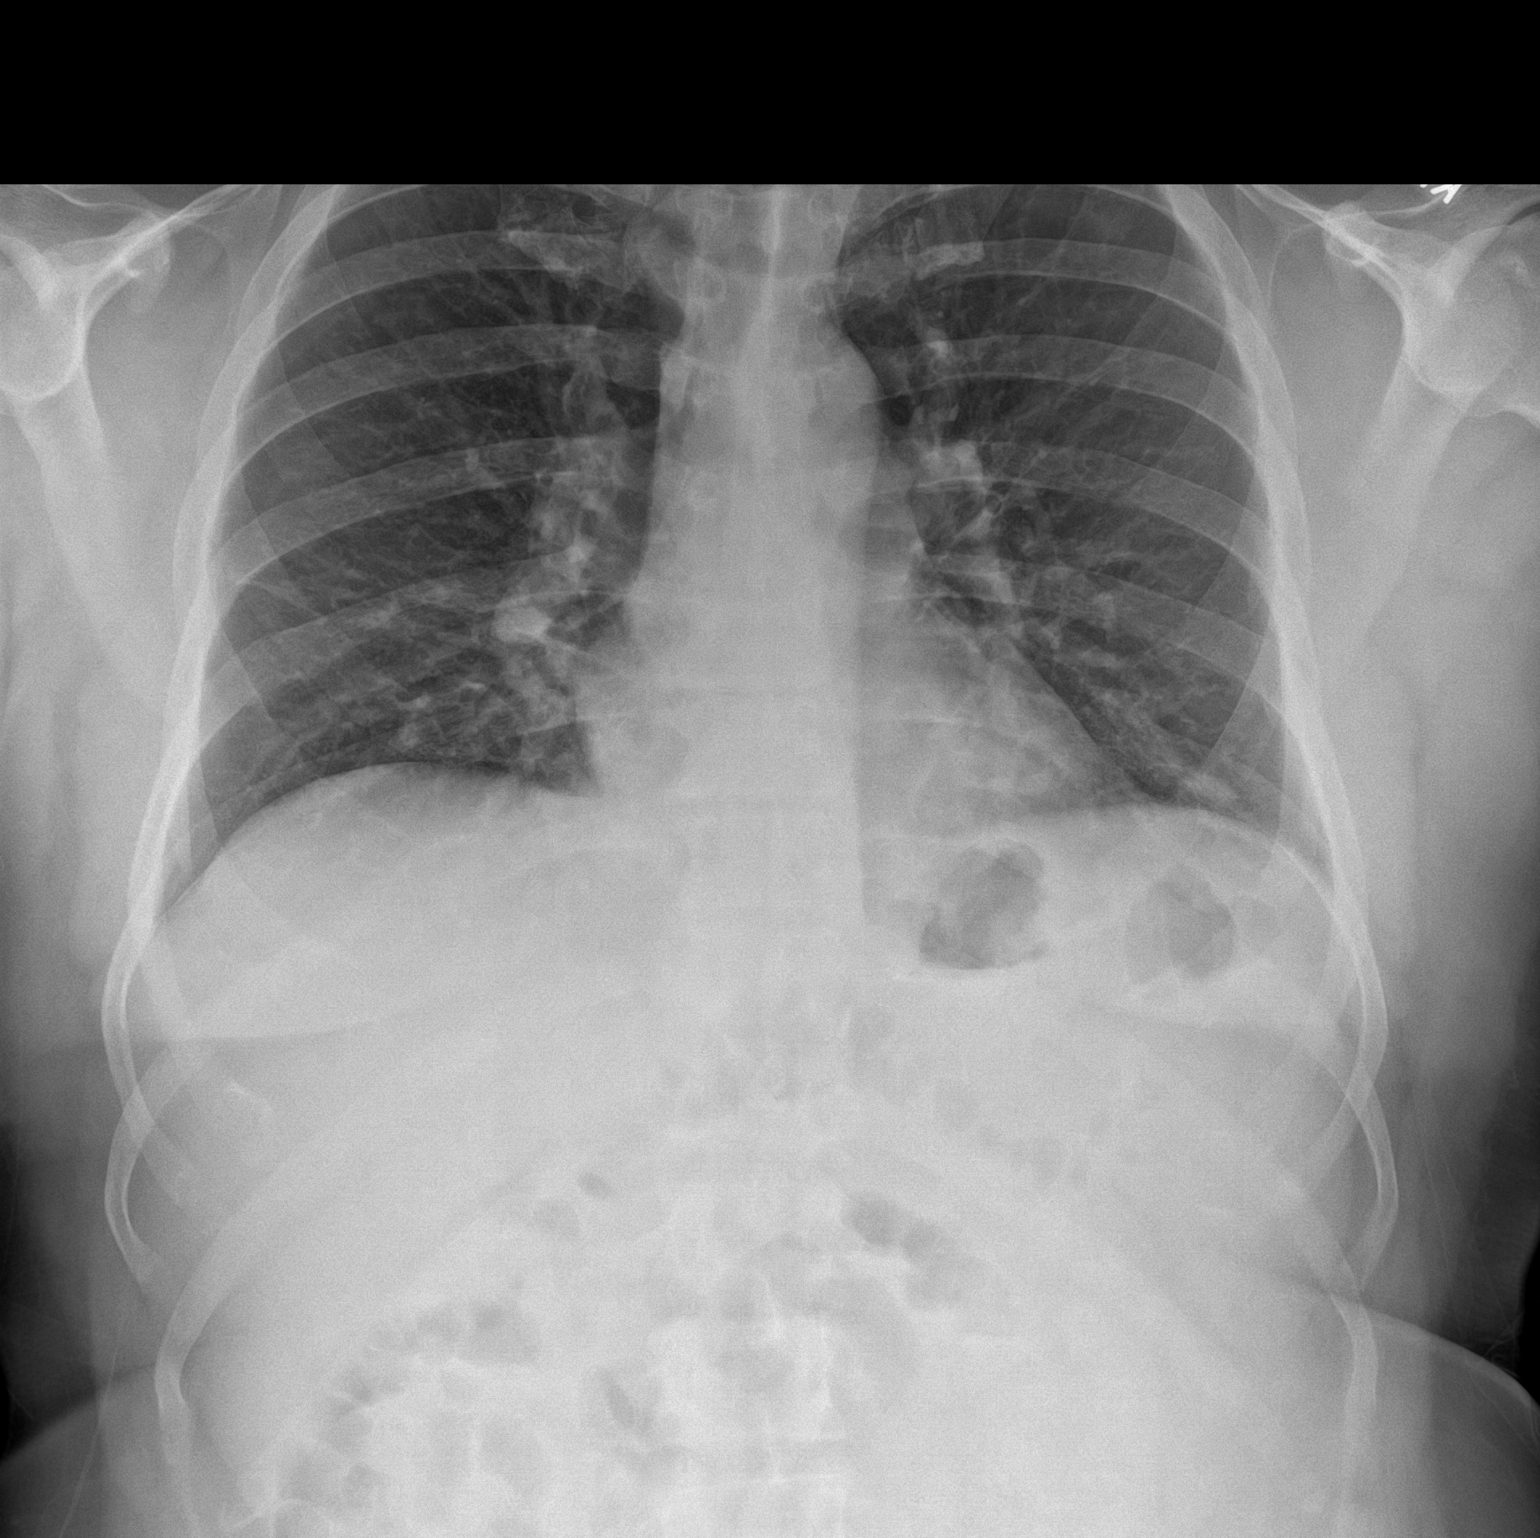

[chest lat]
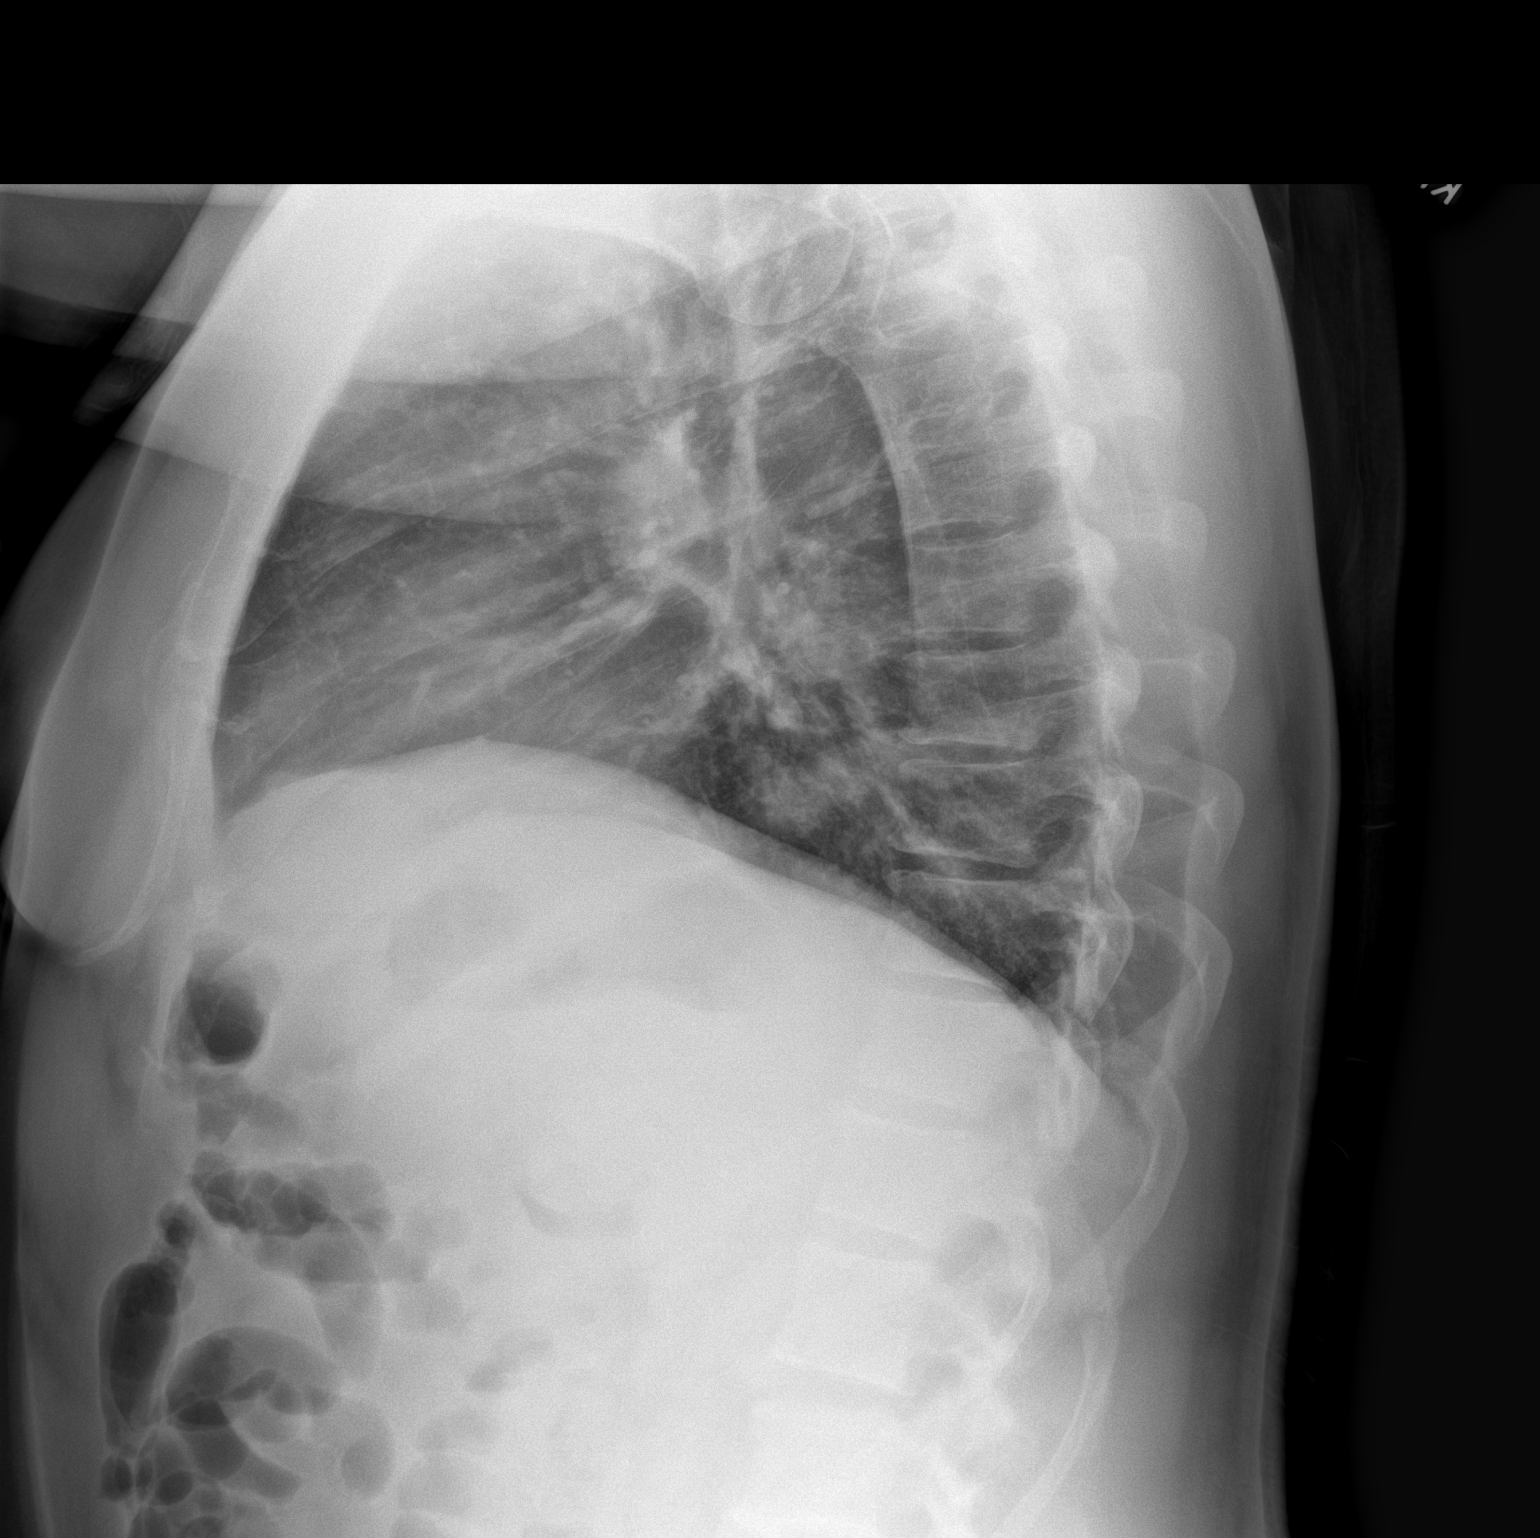

[2 of 2 positions shown; findings below may reference images not displayed]

FINDINGS: The heart, hila, and mediastinum are normal. No pneumothorax. No
nodules or masses. Minimal atelectasis in the left base. No
suspicious infiltrates. No nodules or masses.
IMPRESSION: No active cardiopulmonary disease.

## 2023-08-12 ENCOUNTER — Other Ambulatory Visit: Payer: Self-pay

## 2023-08-12 ENCOUNTER — Emergency Department (HOSPITAL_BASED_OUTPATIENT_CLINIC_OR_DEPARTMENT_OTHER): Payer: 59

## 2023-08-12 ENCOUNTER — Encounter (HOSPITAL_BASED_OUTPATIENT_CLINIC_OR_DEPARTMENT_OTHER): Payer: Self-pay

## 2023-08-12 ENCOUNTER — Emergency Department (HOSPITAL_BASED_OUTPATIENT_CLINIC_OR_DEPARTMENT_OTHER)
Admission: EM | Admit: 2023-08-12 | Discharge: 2023-08-12 | Disposition: A | Discharge status: Home / Self Care | Payer: 59

## 2023-08-12 DIAGNOSIS — R112 Nausea with vomiting, unspecified: Secondary | ICD-10-CM | POA: Diagnosis present

## 2023-08-12 DIAGNOSIS — R1084 Generalized abdominal pain: Secondary | ICD-10-CM | POA: Diagnosis not present

## 2023-08-12 LAB — COMPREHENSIVE METABOLIC PANEL
ALT: 41 U/L (ref 0–44)
AST: 27 U/L (ref 15–41)
Albumin: 4 g/dL (ref 3.5–5.0)
Alkaline Phosphatase: 90 U/L (ref 38–126)
Anion gap: 8 (ref 5–15)
BUN: 14 mg/dL (ref 6–20)
CO2: 26 mmol/L (ref 22–32)
Calcium: 9.6 mg/dL (ref 8.9–10.3)
Chloride: 105 mmol/L (ref 98–111)
Creatinine, Ser: 0.91 mg/dL (ref 0.61–1.24)
GFR, Estimated: >60 mL/min (ref >60)
Glucose, Bld: 130 mg/dL — ABNORMAL HIGH (ref 70–99)
Potassium: 3.7 mmol/L (ref 3.5–5.1)
Sodium: 139 mmol/L (ref 135–145)
Total Bilirubin: 0.8 mg/dL (ref 0.0–1.2)
Total Protein: 7 g/dL (ref 6.5–8.1)

## 2023-08-12 LAB — CBC
HCT: 44.3 % (ref 39.0–52.0)
Hemoglobin: 15.7 g/dL (ref 13.0–17.0)
MCH: 28.9 pg (ref 26.0–34.0)
MCHC: 35.4 g/dL (ref 30.0–36.0)
MCV: 81.4 fL (ref 80.0–100.0)
Platelets: 190 10*3/uL (ref 150–400)
RBC: 5.44 MIL/uL (ref 4.22–5.81)
RDW: 12.1 % (ref 11.5–15.5)
WBC: 16.5 10*3/uL — ABNORMAL HIGH (ref 4.0–10.5)
nRBC: 0 % (ref 0.0–0.2)

## 2023-08-12 LAB — URINALYSIS, ROUTINE W REFLEX MICROSCOPIC
Bacteria, UA: NONE SEEN
Bilirubin Urine: NEGATIVE
Glucose, UA: NEGATIVE mg/dL
Hgb urine dipstick: NEGATIVE
Ketones, ur: NEGATIVE mg/dL
Leukocytes,Ua: NEGATIVE
Nitrite: NEGATIVE
Protein, ur: 30 mg/dL — AB
Specific Gravity, Urine: >1.046 — ABNORMAL HIGH (ref 1.005–1.030)
pH: 8.5 — ABNORMAL HIGH (ref 5.0–8.0)

## 2023-08-12 LAB — LIPASE, BLOOD: Lipase: 15 U/L (ref 11–51)

## 2023-08-12 MED ORDER — MORPHINE SULFATE (PF) 2 MG/ML IV SOLN
2.0000 mg | Freq: Once | INTRAVENOUS | Status: AC
Start: 2023-08-12 — End: 2023-08-12
  Administered 2023-08-12: 2 mg via INTRAVENOUS
  Filled 2023-08-12: qty 1

## 2023-08-12 MED ORDER — IOHEXOL 300 MG/ML  SOLN
100.0000 mL | Freq: Once | INTRAMUSCULAR | Status: AC | PRN
  Administered 2023-08-12: 100 mL via INTRAVENOUS

## 2023-08-12 MED ORDER — ALUM & MAG HYDROXIDE-SIMETH 200-200-20 MG/5ML PO SUSP
30.0000 mL | Freq: Once | ORAL | Status: AC
  Administered 2023-08-12: 30 mL via ORAL
  Filled 2023-08-12: qty 30

## 2023-08-12 MED ORDER — FENTANYL CITRATE PF 50 MCG/ML IJ SOSY
50.0000 ug | PREFILLED_SYRINGE | Freq: Once | INTRAMUSCULAR | Status: AC
  Administered 2023-08-12: 50 ug via INTRAVENOUS
  Filled 2023-08-12: qty 1

## 2023-08-12 MED ORDER — HYOSCYAMINE SULFATE 0.125 MG SL SUBL: 0.1250 mg | SUBLINGUAL_TABLET | SUBLINGUAL | 0 refills | Status: AC | PRN

## 2023-08-12 MED ORDER — ONDANSETRON HCL 4 MG/2ML IJ SOLN
4.0000 mg | Freq: Once | INTRAMUSCULAR | Status: AC
Start: 2023-08-12 — End: 2023-08-12
  Administered 2023-08-12: 4 mg via INTRAVENOUS
  Filled 2023-08-12: qty 2

## 2023-08-12 MED ORDER — KETOROLAC TROMETHAMINE 15 MG/ML IJ SOLN
15.0000 mg | Freq: Once | INTRAMUSCULAR | Status: AC
  Administered 2023-08-12: 15 mg via INTRAVENOUS
  Filled 2023-08-12: qty 1

## 2023-08-12 MED ORDER — ACETAMINOPHEN 10 MG/ML IV SOLN: 1000.0000 mg | Freq: Once | INTRAVENOUS | Status: DC

## 2023-08-12 MED ORDER — ONDANSETRON 4 MG PO TBDP: 4.0000 mg | ORAL_TABLET | Freq: Three times a day (TID) | ORAL | 0 refills | Status: AC | PRN

## 2023-08-12 NOTE — ED Provider Notes (Signed)
[name] EMERGENCY DEPARTMENT AT [facility] Provider Note   CSN: [id number] Arrival date & time: 08/12/23  8657     History  Chief Complaint  Patient presents with   Abdominal Pain    Jay Johnston is a 53 y.o. male.  53 year old male with past medical history of hyperlipidemia and GERD presenting to the emergency department today with abdominal pain.  The patient states he has been having diffuse abdominal pain since 2 AM today.  The patient has had some chills with this.  He has had some nausea and vomiting has been nonbloody nonbilious.  Denies any diarrhea.  The patient denies any history of abdominal surgeries.  He denies any associated urinary symptoms.  The pain came on relatively abruptly.  He was brought to the emergency department that time for further evaluation.   Abdominal Pain Associated symptoms: nausea and vomiting        Home Medications Prior to Admission medications   Medication Sig Start Date End Date Taking? Authorizing Provider  hyoscyamine (LEVSIN/SL) 0.125 MG SL tablet Place 1 tablet (0.125 mg total) under the tongue every 4 (four) hours as needed. 08/12/23  Yes Durwin Glaze, MD  ondansetron (ZOFRAN-ODT) 4 MG disintegrating tablet Take 1 tablet (4 mg total) by mouth every 8 (eight) hours as needed for nausea or vomiting. 08/12/23  Yes Durwin Glaze, MD  atorvastatin (LIPITOR) 10 MG tablet Take 10 mg by mouth at bedtime. 12/13/21   [provider]  pantoprazole (PROTONIX) 40 MG tablet Take 1 tablet (40 mg total) by mouth daily before breakfast. 12/30/21   Yates Decamp, MD      Allergies    Patient has no known allergies.    Review of Systems   Review of Systems  Gastrointestinal:  Positive for abdominal pain, nausea and vomiting.  All other systems reviewed and are negative.   Physical Exam Updated Vital Signs BP 121/77   Pulse 60   Temp 99.1 F (37.3 C) (Oral)   Resp 12   Ht 5\' 10"  (1.778 m)   Wt 101.9 kg   SpO2 99%   BMI  32.23 kg/m  Physical Exam Vitals and nursing note reviewed.   Gen: Appears uncomfortable Eyes: PERRL, EOMI HEENT: no oropharyngeal swelling Neck: trachea midline Resp: clear to auscultation bilaterally Card: RRR, no murmurs, rubs, or gallops Abd: Mild diffuse tenderness with no guarding or rebound Extremities: no calf tenderness, no edema Vascular: 2+ radial pulses bilaterally, 2+ DP pulses bilaterally Skin: no rashes Psyc: acting appropriately   ED Results / Procedures / Treatments   Labs (all labs ordered are listed, but only abnormal results are displayed) Labs Reviewed  COMPREHENSIVE METABOLIC PANEL - Abnormal; Notable for the following components:      Result Value   Glucose, Bld 130 (*)    All other components within normal limits  CBC - Abnormal; Notable for the following components:   WBC 16.5 (*)    All other components within normal limits  URINALYSIS, ROUTINE W REFLEX MICROSCOPIC - Abnormal; Notable for the following components:   Specific Gravity, Urine >1.046 (*)    pH 8.5 (*)    Protein, ur 30 (*)    All other components within normal limits  LIPASE, BLOOD    EKG EKG Interpretation Date/Time:  Friday August 12 2023 06:19:23 EST Ventricular Rate:  68 PR Interval:  159 QRS Duration:  92 QT Interval:  418 QTC Calculation: 445 R Axis:   68  Text Interpretation: Sinus  rhythm ST elev, probable normal early repol pattern Interpretation limited secondary to artifact Confirmed by Zadie Rhine ([id number]) on 08/12/2023 6:35:45 AM  Radiology CT ABDOMEN PELVIS W CONTRAST Result Date: 08/12/2023 CLINICAL DATA:  Acute generalized abdominal pain. EXAM: CT ABDOMEN AND PELVIS WITH CONTRAST TECHNIQUE: Multidetector CT imaging of the abdomen and pelvis was performed using the standard protocol following bolus administration of intravenous contrast. RADIATION DOSE REDUCTION: This exam was performed according to the departmental dose-optimization program which includes  automated exposure control, adjustment of the mA and/or kV according to patient size and/or use of iterative reconstruction technique. CONTRAST:  OMNIPAQUE IOHEXOL 300 MG/ML  SOLN COMPARISON:  None Available. FINDINGS: Lower chest: No acute abnormality. Hepatobiliary: No focal liver abnormality is seen. No gallstones, gallbladder wall thickening, or biliary dilatation. Pancreas: Unremarkable. No pancreatic ductal dilatation or surrounding inflammatory changes. Spleen: Normal in size without focal abnormality. Adrenals/Urinary Tract: Adrenal glands are unremarkable. Kidneys are normal, without renal calculi, focal lesion, or hydronephrosis. Bladder is unremarkable. Stomach/Bowel: The stomach and appendix are unremarkable. No colonic dilatation is noted. No inflammation is noted. Mildly dilated small bowel loops are noted centrally within the abdomen with some fecalization present. Definitive transition zone is not identified and is uncertain if this represents some degree of partial obstruction or potentially focal ileus. Vascular/Lymphatic: No significant vascular findings are present. No enlarged abdominal or pelvic lymph nodes. Reproductive: Prostate is unremarkable. Other: Small fat containing left inguinal hernia.  No ascites. Musculoskeletal: No acute or significant osseous findings. IMPRESSION: Mildly dilated small bowel loops are noted centrally within the abdomen with some fecalization present. Definitive transition zone is not identified and it is uncertain if this represents some degree of partial obstruction or potentially focal ileus. Electronically Signed   By: Lupita Raider M.D.   On: 08/12/2023 08:18    Procedures Procedures    Medications Ordered in ED Medications  ondansetron (ZOFRAN) injection 4 mg (4 mg Intravenous Given 08/12/23 0626)  fentaNYL (SUBLIMAZE) injection 50 mcg (50 mcg Intravenous Given 08/12/23 0626)  morphine (PF) 2 MG/ML injection 2 mg (2 mg Intravenous Given  08/12/23 0750)  iohexol (OMNIPAQUE) 300 MG/ML solution 100 mL (100 mLs Intravenous Contrast Given 08/12/23 0737)  ketorolac (TORADOL) 15 MG/ML injection 15 mg (15 mg Intravenous Given 08/12/23 0751)  alum & mag hydroxide-simeth (MAALOX/MYLANTA) 200-200-20 MG/5ML suspension 30 mL (30 mLs Oral Given 08/12/23 1213)    ED Course/ Medical Decision Making/ A&P                                 Medical Decision Making 53 year old male with past medical history of hyperlipidemia and GERD presenting to the emergency department today with diffuse abdominal pain.  I will further evaluate the patient here with basic labs including LFTs and lipase.  Will obtain a CT scan of his abdomen to evaluate for appendicitis, diverticulitis, colitis, hepatobiliary otology, ureterolithiasis, or other intra-abdominal pathology.  Will give patient morphine and Zofran as well as offer Mab for his pain.  I will reevaluate for ultimate disposition.  The patient's labs are reassuring with exception of a mild leukocytosis.  He states that after the initial medications that he did have a loose stool here.  This does seem to be relatively chronic looking back through his chart.  His CT scan showed ileus versus early small bowel obstruction.  The patient was feeling better after Toradol here.  He was no longer vomiting.  He did well with a p.o. challenge.  Did call and discussed this with Tresa Endo from surgery.  She recommends having the patient follow-up with his primary care provider and to return to the ER as needed but felt that surgical evaluation is not warranted with his symptoms improving at this time.  He will be discharged with PCP follow-up with return precautions.  Amount and/or Complexity of Data Reviewed Labs: ordered. Radiology: ordered.  Risk OTC drugs. Prescription drug management.           Final Clinical Impression(s) / ED Diagnoses Final diagnoses:  Nausea and vomiting, unspecified vomiting type    Rx /  DC Orders ED Discharge Orders          Ordered    hyoscyamine (LEVSIN/SL) 0.125 MG SL tablet  Every 4 hours PRN        08/12/23 1313    ondansetron (ZOFRAN-ODT) 4 MG disintegrating tablet  Every 8 hours PRN        08/12/23 1313              Durwin Glaze, MD 08/12/23 1314

## 2023-08-12 NOTE — Discharge Instructions (Signed)
Your workup today was reassuring overall.  Please take the medications as prescribed as needed.  I did touch base with our surgeon.  They felt that you could just follow-up with your primary care doctor.  If you have worsening symptoms please either go to [facility] or [facility] as you may require admission at that time.  Please stick to a clear liquid diet this evening and into tomorrow and gradually get back to a normal diet.  Please return for worsening symptoms.

## 2023-08-12 NOTE — ED Triage Notes (Signed)
Pt POV from home with wife d/t Mid ABD pain with N/V since 0200.  Pt took Omeprazole and Tums with no relief.

## 2023-08-12 NOTE — ED Provider Triage Note (Signed)
Emergency Medicine Provider Triage Evaluation Note  Jay Johnston , a 53 y.o. male  was evaluated in triage.  Pt complains of abdominal pain and vomiting.  Review of Systems  Positive: Abdominal pain Negative: Chest pain  Physical Exam  BP (!) 124/94   Pulse 62   Temp (!) 97.5 F (36.4 C) (Oral)   Resp 15   Ht 1.778 m (5\' 10" )   Wt 101.9 kg   SpO2 98%   BMI 32.23 kg/m  Gen:   Awake, appears uncomfortable Resp:  Normal effort  MSK:   Moves extremities without difficulty  Other:  Epigastric abdominal tenderness  Medical Decision Making  Medically screening exam initiated at 6:35 AM.  Appropriate orders placed.  Jay Johnston was informed that the remainder of the evaluation will be completed by another provider, this initial triage assessment does not replace that evaluation, and the importance of remaining in the ED until their evaluation is complete.     Zadie Rhine, MD 08/12/23 [id number]

## 2023-08-12 NOTE — ED Notes (Signed)
Patient reports no nausea or vomiting. Maalox administered for indigestion. Denies pain 0/10.
